# Patient Record
Sex: Male | Born: 1955 | Race: White | Hispanic: No | Marital: Married | Smoking: Former smoker
Health system: Southern US, Community
[De-identification: ages and names within clinical notes are randomized; demographics above are authoritative.]

## PROBLEM LIST (undated history)

## (undated) DIAGNOSIS — F1010 Alcohol abuse, uncomplicated: Secondary | ICD-10-CM

## (undated) DIAGNOSIS — K219 Gastro-esophageal reflux disease without esophagitis: Secondary | ICD-10-CM

## (undated) DIAGNOSIS — F329 Major depressive disorder, single episode, unspecified: Secondary | ICD-10-CM

## (undated) DIAGNOSIS — G43909 Migraine, unspecified, not intractable, without status migrainosus: Secondary | ICD-10-CM

## (undated) DIAGNOSIS — I10 Essential (primary) hypertension: Secondary | ICD-10-CM

## (undated) DIAGNOSIS — F32A Depression, unspecified: Secondary | ICD-10-CM

## (undated) DIAGNOSIS — M1990 Unspecified osteoarthritis, unspecified site: Secondary | ICD-10-CM

## (undated) DIAGNOSIS — Z87448 Personal history of other diseases of urinary system: Secondary | ICD-10-CM

## (undated) HISTORY — PX: COLONOSCOPY: SHX174

## (undated) HISTORY — DX: Essential (primary) hypertension: I10

## (undated) HISTORY — DX: Gastro-esophageal reflux disease without esophagitis: K21.9

## (undated) HISTORY — DX: Personal history of other diseases of urinary system: Z87.448

## (undated) HISTORY — DX: Migraine, unspecified, not intractable, without status migrainosus: G43.909

## (undated) HISTORY — DX: Depression, unspecified: F32.A

## (undated) HISTORY — DX: Alcohol abuse, uncomplicated: F10.10

## (undated) HISTORY — PX: HERNIA REPAIR: SHX51

## (undated) HISTORY — DX: Unspecified osteoarthritis, unspecified site: M19.90

---
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
## 2018-11-17 ENCOUNTER — Ambulatory Visit (INDEPENDENT_AMBULATORY_CARE_PROVIDER_SITE_OTHER): Payer: Managed Care, Other (non HMO)

## 2018-11-17 ENCOUNTER — Ambulatory Visit (INDEPENDENT_AMBULATORY_CARE_PROVIDER_SITE_OTHER): Payer: Managed Care, Other (non HMO) | Admitting: Orthopaedic Surgery

## 2018-11-17 ENCOUNTER — Encounter: Payer: Self-pay | Admitting: Orthopaedic Surgery

## 2018-11-17 VITALS — BP 148/88 | HR 100 | Ht 70.0 in | Wt 218.0 lb

## 2018-11-17 DIAGNOSIS — M25562 Pain in left knee: Secondary | ICD-10-CM | POA: Diagnosis not present

## 2018-11-21 ENCOUNTER — Encounter: Payer: Self-pay | Admitting: Orthopaedic Surgery

## 2018-11-21 NOTE — Progress Notes (Addendum)
Office Visit Note   Patient: Erik Ayers           Date of Birth: 06-19-1955           MRN: [id number] Visit Date: 11/17/2018              Requested by: No referring provider defined for this encounter. PCP: Toma Deiters, MD   Assessment & Plan: Visit Diagnoses:  1. Acute pain of left knee     Plan: 63 year old male with greater than 105-month history of persistent knee catching pain failure to respond to conservative treatment.  We discussed options including MRI scan versus continue strengthening exercises anti-inflammatories Voltaren cream.  Patient states he like proceed with an MRI since his knee is catching on him he is concerned he has a meniscal tear office follow-up after scan for review.  Follow-Up Instructions: return after knee MRI  Orders:  Orders Placed This Encounter  Procedures  . XR KNEE 3 VIEW LEFT  . MR Knee Left w/o contrast   No orders of the defined types were placed in this encounter.     Procedures: No procedures performed   Clinical Data: No additional findings.   Subjective: Chief Complaint  Patient presents with  . Left Knee - Pain    HPI 63 year old male with 103-month history of left knee pain that began insidiously no known injury.  He is taking over-the-counter medication and also using both tearing cream 3 times a day with temporary relief of his left leg.  Patient states he has been limping and now starting have some increased problems with his right leg.  Patient states that when he was walking felt like his left knee popped out to the right and he had sharp pain on the inside joint line.  No past history of knee surgeries.referred to me by Dr. Olena Leatherwood for continued left knee pain.   Review of Systems.  Umbilical hernia patient smokes 3 cigarettes/day. Positive   For current knee pain left knee.  Possible acid reflux history of alcoholism.  Bladder problems depression, hypertension, migraines.  Hernia repair 2018.  Patient states he  rarely drinks with his past history of alcoholism.  He takes medications for cholesterol and also hypertension.   Objective: Vital Signs: BP (!) 148/88   Pulse 100   Ht 5\' 10"  (1.778 m)   Wt 218 lb (98.9 kg)   BMI 31.28 kg/m   Physical Exam Constitutional:      Appearance: He is well-developed.  HENT:     Head: Normocephalic and atraumatic.  Eyes:     Pupils: Pupils are equal, round, and reactive to light.  Neck:     Thyroid: No thyromegaly.     Trachea: No tracheal deviation.  Cardiovascular:     Rate and Rhythm: Normal rate.  Pulmonary:     Effort: Pulmonary effort is normal.     Breath sounds: No wheezing.  Abdominal:     General: Bowel sounds are normal.     Palpations: Abdomen is soft.  Skin:    General: Skin is warm and dry.     Capillary Refill: Capillary refill takes less than 2 seconds.  Neurological:     Mental Status: He is alert and oriented to person, place, and time.  Psychiatric:        Behavior: Behavior normal.        Thought Content: Thought content normal.        Judgment: Judgment normal.  Ortho Exam patient is ambulatory with a mild left knee limp.  He has some knee pain when he gets from sitting to standing.  Negative logroll to the hips.  Negative straight leg raising degrees -5 depression test.  Medial joint line left knee is tender with palpation.  Pes bursa is normal hamstrings are normal.  Mild crepitus with patellar tracking.  No patellar instability.  ACL PCL exam is normal.  Specialty Comments:  No specialty comments available.  Imaging: Three-view x-rays left knee obtained and reviewed.  Negative for acute bony changes.  Some joint space narrowing and spurring consistent with chondromalacia.  Impression.  Left knee x-rays negative for acute changes.  No soft tissue calcification.    PMFS History: There are no active problems to display for this patient.  Past Medical History:  Diagnosis Date  . Alcohol abuse   . Arthritis    . Depression   . GERD (gastroesophageal reflux disease)   . H/O bladder problems   . Hypertension   . Migraines     History reviewed. No pertinent family history.  History reviewed. No pertinent surgical history. Social History   Occupational History  . Not on file  Tobacco Use  . Smoking status: Current Every Day Smoker  . Smokeless tobacco: Never Used  Substance and Sexual Activity  . Alcohol use: Yes  . Drug use: Not on file  . Sexual activity: Not on file

## 2018-12-06 ENCOUNTER — Telehealth: Payer: Self-pay | Admitting: Radiology

## 2018-12-06 NOTE — Telephone Encounter (Signed)
He had cons tx by LMD for 3 months. Call pt let him know denied. He can call insurance co. if he wants. ROV 6 wks and will resubmit.

## 2018-12-06 NOTE — Telephone Encounter (Signed)
MRI ordered for patient has been denied by insurance due to no information regarding failed conservative treatment for 6 weeks. Denial paperwork put on your desk to review. Please advise what you would like for me to do going further.

## 2018-12-07 NOTE — Telephone Encounter (Signed)
I left detailed message explaining to patient. I did advise he could call insurance company or make follow up appt to see Dr. Lorin Mercy 6 weeks after last visit to resubmit.

## 2018-12-08 ENCOUNTER — Telehealth: Payer: Self-pay | Admitting: Radiology

## 2018-12-08 NOTE — Telephone Encounter (Signed)
Patient is having a lot of pain in his knee. He is awaiting follow up in the office to resubmit for MRI due to insurance denial.  He requests work note for no prolonged standing until MRI is complete.  Please advise. CB for pt is [phone]

## 2018-12-08 NOTE — Telephone Encounter (Signed)
Yes OK to do thanks 

## 2018-12-09 NOTE — Telephone Encounter (Signed)
Note entered. Copy left in [place] office for patient to pick up.  I left voicemail for patient advising.

## 2018-12-15 ENCOUNTER — Encounter: Payer: Self-pay | Admitting: Orthopaedic Surgery

## 2018-12-15 ENCOUNTER — Ambulatory Visit (INDEPENDENT_AMBULATORY_CARE_PROVIDER_SITE_OTHER): Payer: Managed Care, Other (non HMO) | Admitting: Orthopaedic Surgery

## 2018-12-15 ENCOUNTER — Other Ambulatory Visit: Payer: Self-pay

## 2018-12-15 DIAGNOSIS — M2392 Unspecified internal derangement of left knee: Secondary | ICD-10-CM | POA: Diagnosis not present

## 2018-12-15 NOTE — Addendum Note (Signed)
Addended by: Meyer Cory on: 12/15/2018 02:24 PM   Modules accepted: Orders

## 2018-12-15 NOTE — Progress Notes (Signed)
Office Visit Note   Patient: Erik Ayers           Date of Birth: [birth year]           MRN: [id number] Visit Date: 12/15/2018              Requested by: Toma Deiters, MD [address] [place],  [place] [address] PCP: Toma Deiters, MD   Assessment & Plan: Visit Diagnoses:  1. Knee locking, left     Plan: Patient is having persistent knee symptoms now 4 months with medial joint line pain failed conservative treatment anti-inflammatories exercise program.  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  Patient needs an MRI scan as I stated last month.  We will resubmit this to his insurance company.  Follow-Up Instructions: No follow-ups on file.   Orders:  No orders of the defined types were placed in this encounter.  No orders of the defined types were placed in this encounter.     Procedures: No procedures performed   Clinical Data: No additional findings.   Subjective: Chief Complaint  Patient presents with  . Left Knee - Pain, Follow-up    HPI 63 year old male returns with persistent problems with his left knee.  He had trouble doing his work and states he is now been put on short-term disability since MRI scan could not be obtained since it was not approved by his insurance.  Prior to me seeing the patient 11/17/2018 he had had greater than 6 weeks conservative treatment including anti-inflammatories, topical Voltaren cream, activity modification, quadriceps exercises.  He has been treated by his PCP  Dr. Olena Leatherwood conservatively prior to referral to me on 11/17/2018.  Patient states his knee still hurts primarily in the medial side he still limping still having catching in his knee.  Review of Systems 14 point review of systems positive for 3 cigarette smoker per day plus for umbilical hernia.  Positive for acid reflux.  History of depression migraines, hypertension hernia repair 2018  otherwise unchanged from 11/17/2018 other than as mentioned HPI.   Objective: Vital Signs: BP (!) 150/94   Pulse 92   Ht 5\' 10"  (1.778 m)   Wt 220 lb (99.8 kg)   BMI 31.57 kg/m   Physical Exam Constitutional:      Appearance: He is well-developed.  HENT:     Head: Normocephalic and atraumatic.  Eyes:     Pupils: Pupils are equal, round, and reactive to light.  Neck:     Thyroid: No thyromegaly.     Trachea: No tracheal deviation.  Cardiovascular:     Rate and Rhythm: Normal rate.  Pulmonary:     Effort: Pulmonary effort is normal.     Breath sounds: No wheezing.  Abdominal:     General: Bowel sounds are normal.     Palpations: Abdomen is soft.  Skin:    General: Skin is warm and dry.     Capillary Refill: Capillary refill takes less than 2 seconds.  Neurological:     Mental Status: He is alert and oriented to person, place, and time.  Psychiatric:        Behavior: Behavior normal.        Thought Content: Thought content normal.        Judgment: Judgment normal.     Ortho Exam patient is amatory with a left knee limp he has medial  joint line tenderness with palpation.  Anterior drawer is negative negative Lachman.No tenderness over the pes bursa or Gertie's tubercle.  He still has crepitus with knee flexion extension primarily with patellofemoral loading.  No lateral subluxation of the patella.  Negative logroll to the hips no sciatic notch tenderness. Specialty Comments:  No specialty comments available.  Imaging: No results found.   PMFS History: Patient Active Problem List   Diagnosis Date Noted  . Knee locking, left 12/15/2018   Past Medical History:  Diagnosis Date  . Alcohol abuse   . Arthritis   . Depression   . GERD (gastroesophageal reflux disease)   . H/O bladder problems   . Hypertension   . Migraines     No family history on file.  No past surgical history on file. Social History   Occupational History  . Not on file  Tobacco Use  .  Smoking status: Current Every Day Smoker  . Smokeless tobacco: Never Used  Substance and Sexual Activity  . Alcohol use: Yes  . Drug use: Not on file  . Sexual activity: Not on file

## 2018-12-28 ENCOUNTER — Encounter: Payer: Self-pay | Admitting: Orthopaedic Surgery

## 2019-01-02 ENCOUNTER — Telehealth: Payer: Self-pay | Admitting: Orthopaedic Surgery

## 2019-01-02 NOTE — Telephone Encounter (Signed)
I called patient. He is inquiring about forms that he took to the [place] office to be completed. I do not have forms here. Tammy does not see where they have been logged in with [employer]. I have reached out to [place] in [place] office to see if forms are there.  Awaiting response from [place].

## 2019-01-02 NOTE — Telephone Encounter (Signed)
Patient called. Would like to speak with [facility]. His call back number is [phone]

## 2019-01-02 NOTE — Telephone Encounter (Signed)
Forms faxed to me from [place] office. I will complete and fax in today.

## 2019-01-02 NOTE — Telephone Encounter (Signed)
I left voicemail for patient requesting return call to let me know first date out of work.

## 2019-01-03 ENCOUNTER — Telehealth: Payer: Self-pay | Admitting: Orthopaedic Surgery

## 2019-01-03 NOTE — Telephone Encounter (Signed)
Duplicate message in chart. Form has been completed and faxed. I left voicemail for patient advising.

## 2019-01-03 NOTE — Telephone Encounter (Signed)
I left voicemail for patient advising form has been faxed.

## 2019-01-03 NOTE — Telephone Encounter (Signed)
Pt called in wanting to let betsy know the information she needed to complete his STD forms. Pt said his first day out of work was 12-12-2018.  [phone]

## 2019-01-05 ENCOUNTER — Ambulatory Visit (INDEPENDENT_AMBULATORY_CARE_PROVIDER_SITE_OTHER): Payer: Managed Care, Other (non HMO) | Admitting: Orthopaedic Surgery

## 2019-01-05 ENCOUNTER — Encounter: Payer: Self-pay | Admitting: Orthopaedic Surgery

## 2019-01-05 DIAGNOSIS — M1712 Unilateral primary osteoarthritis, left knee: Secondary | ICD-10-CM | POA: Diagnosis not present

## 2019-01-05 NOTE — Progress Notes (Signed)
Office Visit Note   Patient: Erik Ayers           Date of Birth: 08/05/1955           MRN: [id number] Visit Date: 01/05/2019              Requested by: Neale Burly, MD [place],  [place] [address] PCP: Neale Burly, MD   Assessment & Plan: Visit Diagnoses:  1. 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  MRI scan is reviewed which shows bone-on-bone changes medial compartment and areas of complete cartilage loss in medial compartment with sparing of the lateral compartment.  Patient has subchondral edema in the medial compartment.  Medial meniscal posterior complex meniscal tear and some extrusion of the medial meniscus.  I discussed them I do not think that knee arthroscopy is likely given the relief that he would need and with bone-on-bone changes medial compartment he would require total knee arthroplasty.  His varus alignment makes results of a medial unicompartment arthroplasty less likely to be successful.  We discussed total knee arthroplasty looked at knee models discussed home therapy for couple weeks post outpatient therapy.  We discussed overnight stay in the hospital and some of the restrictions and decreased elective surgery due to Covid currently in place.  Procedure discussed we will try to get this scheduled whenever we can.  Currently he remains out of work pending total knee arthroplasty and postop rehab.  Follow-Up Instructions: No follow-ups on file.   Orders:  No orders of the defined types were placed in this encounter.  No orders of the defined types were placed in this encounter.     Procedures: No procedures performed   Clinical Data: No additional findings.   Subjective: Chief Complaint  Patient presents with  . Left Knee - Pain, Follow-up    MRI review    HPI 63 year old male returns with ongoing problems with  his left knee with pain medially.  On his job due to [employer] he been using a standing forklift which is more active than the job he been doing before Covid worries mostly sitting down.  When he standing on a forklift he had to use his leg for pushing on the paddles and had increased knee pain swelling and catching.  MRI scan has been obtained and is available for review.  Review of Systems plus for previous umbilical hernia acid reflux.  History of depression migraines hypertension.  Hernia repair 2018.  Positive for left greater than right knee pain with left knee osteoarthritis bone-on-bone changes medial compartment.  Patient smokes 3 cigarettes/day.   Objective: Vital Signs: Ht 5\' 10"  (1.778 m)   Wt 220 lb (99.8 kg)   BMI 31.57 kg/m   Physical Exam Constitutional:      Appearance: He is well-developed.  HENT:     Head: Normocephalic and atraumatic.  Eyes:     Pupils: Pupils are equal, round, and reactive to light.  Neck:     Thyroid: No thyromegaly.     Trachea: No tracheal deviation.  Cardiovascular:     Rate and Rhythm: Normal rate.  Pulmonary:     Effort: Pulmonary effort is normal.     Breath sounds: No wheezing.  Abdominal:     General: Bowel sounds are normal.     Palpations: Abdomen is soft.  Skin:    General: Skin is warm and dry.     Capillary Refill: Capillary refill takes less than 2 seconds.  Neurological:     Mental Status: He is alert and oriented to person, place, and time.  Psychiatric:        Behavior: Behavior normal.        Thought Content: Thought content normal.        Judgment: Judgment normal.     Ortho Exam finished patient has 5 to 10 degrees varus standing position left knee.  Medial joint line tenderness 2+ knee effusion crepitus with knee range of motion pain with patellofemoral loading and knee extension.  No lateral joint line tenderness.  Distal pulses are 2 logroll hips.  No sciatic notch tenderness.  Specialty Comments:  No specialty  comments available.  Imaging: MRI scan done at [facility] radiology 12/28/2018 showed complex medial meniscal tear with some extrusion of medial meniscus.  Significant medial compartment arthritis.  There is edema in the bone bone-on-bone changes cartilage cartilage wear areas of denuded cartilage.  Small Baker's cyst.  Relative sparing of the lateral compartment.   Plain radiographs demonstrated 7 degrees varus with bone-on-bone changes medial compartment marginal osteophytes and some subchondral sclerosis in the medial compartment.    PMFS History: Patient Active Problem List   Diagnosis Date Noted  . Unilateral primary osteoarthritis, left knee 01/05/2019  . Knee locking, left 12/15/2018   Past Medical History:  Diagnosis Date  . Alcohol abuse   . Arthritis   . Depression   . GERD (gastroesophageal reflux disease)   . H/O bladder problems   . Hypertension   . Migraines     No family history on file.  No past surgical history on file. Social History   Occupational History  . Not on file  Tobacco Use  . Smoking status: Current Every Day Smoker  . Smokeless tobacco: Never Used  Substance and Sexual Activity  . Alcohol use: Yes  . Drug use: Not on file  . Sexual activity: Not on file

## 2019-01-10 ENCOUNTER — Telehealth: Payer: Self-pay | Admitting: Orthopaedic Surgery

## 2019-01-10 NOTE — Telephone Encounter (Signed)
Left message on patient's voicemail requesting he call to discuss scheduling left total knee surgery.  Provided name and my direct number.

## 2019-01-12 ENCOUNTER — Other Ambulatory Visit: Payer: Self-pay

## 2019-01-20 ENCOUNTER — Telehealth: Payer: Self-pay | Admitting: Orthopaedic Surgery

## 2019-01-20 NOTE — Telephone Encounter (Signed)
Please advise 

## 2019-01-20 NOTE — Telephone Encounter (Signed)
Patient called.   He is requesting that he be prescribed pain meds for his knee.   Call back number: [phone]

## 2019-01-23 MED ORDER — TRAMADOL HCL 50 MG PO TABS: 50.0000 mg | ORAL_TABLET | Freq: Two times a day (BID) | ORAL | 0 refills | Status: DC | PRN

## 2019-01-23 NOTE — Telephone Encounter (Addendum)
Short term disability claim runs out for patient in the next couple of weeks.  Patient would like for [place] to send letter to [facility] stating he is unable to have surgery due to COVID restrictions and that he will continue to be out of work until post op recovery is completed.    OK for note?  Medication called to pharmacy. Patient aware.

## 2019-01-23 NOTE — Addendum Note (Signed)
Addended by: Rogers Seeds on: 01/23/2019 04:56 PM   Modules accepted: Orders

## 2019-01-23 NOTE — Telephone Encounter (Signed)
Called pt no answer will call again to discuss. Was scheduled for TKA at one point.

## 2019-01-23 NOTE — Telephone Encounter (Signed)
Call in ultram 50mg     # 30   one po q 12 hrs prn pain  thanks [facility]

## 2019-02-03 ENCOUNTER — Other Ambulatory Visit: Payer: Self-pay

## 2019-02-03 ENCOUNTER — Telehealth: Payer: Self-pay | Admitting: Orthopaedic Surgery

## 2019-02-03 NOTE — Telephone Encounter (Signed)
Patient called advised the [facility] [place] need prior auth before they will fill the Rx for Tramadol. The number to contact patient is [phone]

## 2019-02-06 ENCOUNTER — Ambulatory Visit: Admit: 2019-02-06 | Payer: Managed Care, Other (non HMO) | Admitting: Orthopaedic Surgery

## 2019-02-06 SURGERY — ARTHROPLASTY, KNEE, TOTAL
Anesthesia: Spinal | Site: Knee | Laterality: Left

## 2019-02-06 NOTE — Telephone Encounter (Signed)
I called [facility] and pharmacist explained that patient needed prior auth due to number prescribed being greater than 14 days.  Number for prior auth [phone].  I called prior auth line and spoke with Inetta Fermo. Medication approved for #60 a month x 6 months.  She will fax authorization to pharmacy.   I called patient and advised.

## 2019-02-07 NOTE — Progress Notes (Signed)
[employer] James Owens, PA-C for procedure orders. 

## 2019-02-08 ENCOUNTER — Encounter (HOSPITAL_COMMUNITY)
Admission: RE | Admit: 2019-02-08 | Discharge: 2019-02-08 | Disposition: A | Discharge status: Home / Self Care | Payer: Managed Care, Other (non HMO) | Source: Ambulatory Visit | Attending: Orthopaedic Surgery | Admitting: Orthopaedic Surgery

## 2019-02-08 ENCOUNTER — Ambulatory Visit (HOSPITAL_COMMUNITY)
Admission: RE | Admit: 2019-02-08 | Discharge: 2019-02-08 | Disposition: A | Discharge status: Home / Self Care | Payer: Managed Care, Other (non HMO) | Source: Ambulatory Visit | Attending: Surgery | Admitting: Surgery

## 2019-02-08 ENCOUNTER — Other Ambulatory Visit: Payer: Self-pay

## 2019-02-08 ENCOUNTER — Encounter (HOSPITAL_COMMUNITY): Payer: Self-pay

## 2019-02-08 DIAGNOSIS — Z01818 Encounter for other preprocedural examination: Secondary | ICD-10-CM

## 2019-02-08 LAB — COMPREHENSIVE METABOLIC PANEL
ALT: 51 U/L — ABNORMAL HIGH (ref 0–44)
AST: 35 U/L (ref 15–41)
Albumin: 4.4 g/dL (ref 3.5–5.0)
Alkaline Phosphatase: 100 U/L (ref 38–126)
Anion gap: 13 (ref 5–15)
BUN: 9 mg/dL (ref 8–23)
CO2: 26 mmol/L (ref 22–32)
Calcium: 9.7 mg/dL (ref 8.9–10.3)
Chloride: 101 mmol/L (ref 98–111)
Creatinine, Ser: 0.93 mg/dL (ref 0.61–1.24)
GFR calc Af Amer: >60 mL/min (ref >60)
GFR calc non Af Amer: >60 mL/min (ref >60)
Glucose, Bld: 86 mg/dL (ref 70–99)
Potassium: 3.9 mmol/L (ref 3.5–5.1)
Sodium: 140 mmol/L (ref 135–145)
Total Bilirubin: 0.5 mg/dL (ref 0.3–1.2)
Total Protein: 7.6 g/dL (ref 6.5–8.1)

## 2019-02-08 LAB — URINALYSIS, ROUTINE W REFLEX MICROSCOPIC
Bilirubin Urine: NEGATIVE
Glucose, UA: NEGATIVE mg/dL
Hgb urine dipstick: NEGATIVE
Ketones, ur: NEGATIVE mg/dL
Leukocytes,Ua: NEGATIVE
Nitrite: NEGATIVE
Protein, ur: NEGATIVE mg/dL
Specific Gravity, Urine: 1.016 (ref 1.005–1.030)
pH: 7 (ref 5.0–8.0)

## 2019-02-08 LAB — CBC
HCT: 49.4 % (ref 39.0–52.0)
Hemoglobin: 16.7 g/dL (ref 13.0–17.0)
MCH: 32.6 pg (ref 26.0–34.0)
MCHC: 33.8 g/dL (ref 30.0–36.0)
MCV: 96.5 fL (ref 80.0–100.0)
Platelets: 247 10*3/uL (ref 150–400)
RBC: 5.12 MIL/uL (ref 4.22–5.81)
RDW: 12.8 % (ref 11.5–15.5)
WBC: 9.5 10*3/uL (ref 4.0–10.5)
nRBC: 0 % (ref 0.0–0.2)

## 2019-02-08 LAB — SURGICAL PCR SCREEN
MRSA, PCR: NEGATIVE
Staphylococcus aureus: POSITIVE — AB

## 2019-02-08 NOTE — Pre-Procedure Instructions (Signed)
Your procedure is scheduled on Friday, February 12th, from 10:03 AM to 12:03 PM.  Report to [facility] Main Entrance "A" at 08:00 A.M., and check in at the Admitting office.   Call this number if you have problems the morning of surgery:  [phone]    Remember:  Do not eat after midnight the night before your surgery.  You may drink clear liquids until 07:00 AM the morning of your surgery.    Clear liquids allowed are: Water, Non-Citrus Juices (without pulp), Carbonated Beverages, Clear Tea, Black Coffee Only, and Gatorade.    Enhanced Recovery after Surgery for Orthopedics Enhanced Recovery after Surgery is a protocol used to improve the stress on your body and your recovery after surgery.  .  . The day of surgery (if you do NOT have diabetes):  o Drink ONE (1) Pre-Surgery Clear Ensure by 07:00 AM, the morning of your surgery.   o This drink was given to you during your hospital  pre-op appointment visit. o Nothing else to drink after completing the  Pre-Surgery Clear Ensure.  Please, if able, drink it in one setting. DO NOT SIP.     Take these medicines the morning of surgery with A SIP OF WATER :  atorvastatin (LIPITOR)  IF NEEDED: lansoprazole (PREVACID) traMADol (ULTRAM)   7 days prior to surgery STOP taking any Aspirin (unless otherwise instructed by your surgeon), Aleve, Naproxen, Ibuprofen, Motrin, Advil, Goody's, BC's, all herbal medications, fish oil, and all vitamins.    The Morning of Surgery  Do not wear jewelry.  Do not wear lotions, powders, colognes, or deodorant  Men may shave face and neck.  Do not bring valuables to the hospital.  [facility] is not responsible for any belongings or valuables.  If you are a smoker, DO NOT Smoke 24 hours prior to surgery  If you wear a CPAP at night please bring your mask the morning of surgery   Remember that you must have someone to transport you home after your surgery, and remain with you for 24 hours  if you are discharged the same day.   Please bring cases for contacts, glasses, hearing aids, dentures or bridgework because it cannot be worn into surgery.    Leave your suitcase in the car.  After surgery it may be brought to your room.  For patients admitted to the hospital, discharge time will be determined by your treatment team.  Patients discharged the day of surgery will not be allowed to drive home.    Special instructions:   [facility]- Preparing For Surgery  Before surgery, you can play an important role. Because skin is not sterile, your skin needs to be as free of germs as possible. You can reduce the number of germs on your skin by washing with CHG (chlorahexidine gluconate) Soap before surgery.  CHG is an antiseptic cleaner which kills germs and bonds with the skin to continue killing germs even after washing.     Please do not use if you have an allergy to CHG or antibacterial soaps. If your skin becomes reddened/irritated stop using the CHG.  Do not shave (including legs and underarms) for at least 48 hours prior to first CHG shower. It is OK to shave your face.  Please follow these instructions carefully.   1. Shower the NIGHT BEFORE SURGERY and the MORNING OF SURGERY with CHG Soap.   2. If you chose to wash your hair, wash your hair first as usual with  your normal shampoo.  3. After you shampoo, rinse your hair and body thoroughly to remove the shampoo.  4. Use CHG as you would any other liquid soap. You can apply CHG directly to the skin and wash gently with a scrungie or a clean washcloth.   5. Apply the CHG Soap to your body ONLY FROM THE NECK DOWN.  Do not use on open wounds or open sores. Avoid contact with your eyes, ears, mouth and genitals (private parts). Wash Face and genitals (private parts)  with your normal soap.   6. Wash thoroughly, paying special attention to the area where your surgery will be performed.  7. Thoroughly rinse your body with warm  water from the neck down.  8. DO NOT shower/wash with your normal soap after using and rinsing off the CHG Soap.  9. Pat yourself dry with a CLEAN TOWEL.  10. Wear CLEAN PAJAMAS to bed the night before surgery, wear comfortable clothes the morning of surgery  11. Place CLEAN SHEETS on your bed the night of your first shower and DO NOT SLEEP WITH PETS.    Day of Surgery:  Remember to brush your teeth WITH YOUR REGULAR TOOTHPASTE. Please shower the morning of surgery with the CHG soap Do not apply any deodorants/lotions. Please wear clean clothes to the hospital/surgery center.      Please read over the following fact sheets that you were given.

## 2019-02-08 NOTE — Progress Notes (Signed)
   02/08/19 1315  OBSTRUCTIVE SLEEP APNEA  Have you ever been diagnosed with sleep apnea through a sleep study? No  Do you snore loudly (loud enough to be heard through closed doors)?  1  Do you often feel tired, fatigued, or sleepy during the daytime (such as falling asleep during driving or talking to someone)? 0  Has anyone observed you stop breathing during your sleep? 0  Do you have, or are you being treated for high blood pressure? 1  BMI more than 35 kg/m2? 0  Age > 50 (1-yes) 1  Neck circumference greater than:Male 16 inches or larger, Male 17inches or larger? 1  Male Gender (Yes=1) 1  Obstructive Sleep Apnea Score 5  Score 5 or greater  Results sent to PCP

## 2019-02-08 NOTE — Progress Notes (Signed)
PCP - Lia Hopping, MD Cardiologist - Denies  PPM/ICD - Denies  Chest x-ray - 02/08/19 EKG - 02/08/19 Stress Test - Per patient, in 2016 @ [facility], negative results; records requested ECHO - Denies Cardiac Cath - Denies  Sleep Study - Denies  Patient denies being a Diabetic.   Blood Thinner Instructions: N/A Aspirin Instructions: N/A  ERAS Protcol - Yes PRE-SURGERY Ensure or G2- Ensure given  COVID TEST- 02/14/19 @  [facility]   Anesthesia review: Yes; Stress test records requested.  Patient denies shortness of breath, fever, cough and chest pain at PAT appointment  Coronavirus Screening  Have you experienced the following symptoms:  Cough yes/no: No Fever (>100.97F)  yes/no: No Runny nose yes/no: No Sore throat yes/no: No Difficulty breathing/shortness of breath  yes/no: No  Have you or a family member traveled in the last 14 days and where? yes/no: No   If the patient indicates "YES" to the above questions, their PAT will be rescheduled to limit the exposure to others and, the surgeon will be notified. THE PATIENT WILL NEED TO BE ASYMPTOMATIC FOR 14 DAYS.   If the patient is not experiencing any of these symptoms, the PAT nurse will instruct them to NOT bring anyone with them to their appointment since they may have these symptoms or traveled as well.   Please remind your patients and families that hospital visitation restrictions are in effect and the importance of the restrictions.    All instructions explained to the patient, with a verbal understanding of the material. Patient agrees to go over the instructions while at home for a better understanding. Patient also instructed to self quarantine after being tested for COVID-19. The opportunity to ask questions was provided.

## 2019-02-09 ENCOUNTER — Ambulatory Visit (INDEPENDENT_AMBULATORY_CARE_PROVIDER_SITE_OTHER): Payer: Managed Care, Other (non HMO) | Admitting: Surgery

## 2019-02-09 ENCOUNTER — Encounter: Payer: Self-pay | Admitting: Surgery

## 2019-02-09 VITALS — BP 130/79 | HR 91 | Ht 70.0 in | Wt 219.0 lb

## 2019-02-09 DIAGNOSIS — M1712 Unilateral primary osteoarthritis, left knee: Secondary | ICD-10-CM

## 2019-02-09 NOTE — Anesthesia Preprocedure Evaluation (Addendum)
Anesthesia Evaluation  Patient identified by MRN, date of birth, ID band Patient awake    Reviewed: Allergy & Precautions, H&P , NPO status , Patient's Chart, lab work & pertinent test results, reviewed documented beta blocker date and time   Airway Mallampati: II  TM Distance: >3 FB Neck ROM: full    Dental no notable dental hx. (+) Edentulous Upper, Edentulous Lower, Dental Advisory Given   Pulmonary neg pulmonary ROS, former smoker,    Pulmonary exam normal breath sounds clear to auscultation       Cardiovascular Exercise Tolerance: Good hypertension, Pt. on medications negative cardio ROS   Rhythm:regular Rate:Normal     Neuro/Psych  Headaches, PSYCHIATRIC DISORDERS Depression    GI/Hepatic negative GI ROS, (+)     substance abuse  alcohol use,   Endo/Other  negative endocrine ROS  Renal/GU negative Renal ROS  negative genitourinary   Musculoskeletal  (+) Arthritis , Osteoarthritis,    Abdominal   Peds  Hematology negative hematology ROS (+)   Anesthesia Other Findings   Reproductive/Obstetrics negative OB ROS                          Anesthesia Physical Anesthesia Plan  ASA: II  Anesthesia Plan: Spinal and MAC   Post-op Pain Management:  Regional for Post-op pain   Induction:   PONV Risk Score and Plan: 1 and Treatment may vary due to age or medical condition  Airway Management Planned: Nasal Cannula, Natural Airway, Simple Face Mask and Mask  Additional Equipment:   Intra-op Plan:   Post-operative Plan:   Informed Consent: I have reviewed the patients History and Physical, chart, labs and discussed the procedure including the risks, benefits and alternatives for the proposed anesthesia with the patient or authorized representative who has indicated his/her understanding and acceptance.     Dental Advisory Given  Plan Discussed with: CRNA and  Anesthesiologist  Anesthesia Plan Comments: (Pt had stress test in 2014 ordered by PCP Dr. Sherrie Sport that was negative. Results below. PCP note from 10/19/18 reviewed states pt HTN under good control. He was referred to orthopedics at that time for eval of knee pain.   Preop labs reviewed, mildly elevated AST 51. Otherwise unremarkable.   EKG 02/08/19: NSR. Rate 87.  Stress test 11/29/2012: Description of test: The patient exercised for total of 3 minutes and 52 seconds.  Maximum heart rate 569, maximal systolic blood pressure 794, diastolic blood pressure 84.  METS 7.0.  His resting EKG revealed no signs of ischemia.    Stress EKG: No ST segment elevation noted.  Conclusion of the test: Cardiac stress test felt to be negative.  As such, the patient was reassured and advised to continue with present medication and to call me if any other problems.  )      Anesthesia Quick Evaluation

## 2019-02-09 NOTE — Progress Notes (Signed)
Anesthesia Chart Review:  Pt had stress test in 2014 ordered by PCP Dr. Olena Leatherwood that was negative. Results below. PCP note from 10/19/18 reviewed states pt HTN under good control. He was referred to orthopedics at that time for eval of knee pain.   Preop labs reviewed, mildly elevated AST 51. Otherwise unremarkable.   EKG 02/08/19: NSR. Rate 87.  Stress test 11/29/2012: Description of test: The patient exercised for total of 3 minutes and 52 seconds.  Maximum heart rate 144, maximal systolic blood pressure 163, diastolic blood pressure 84.  METS 7.0.  His resting EKG revealed no signs of ischemia.    Stress EKG: No ST segment elevation noted.  Conclusion of the test: Cardiac stress test felt to be negative.  As such, the patient was reassured and advised to continue with present medication and to call me if any other problems.   Zannie Cove [facility] Short Stay Center/Anesthesiology Phone [phone] 02/09/2019 12:32 PM

## 2019-02-09 NOTE — Progress Notes (Signed)
64 year old white male history of end-stage DJD left knee and pain comes in for preop evaluation.  States that symptoms unchanged from previous visit.  He is wanting to proceed with left total knee replacement as scheduled.  Today history and physical performed.  Surgical procedure along with potential rehab/recovery time discussed.  All questions answered.

## 2019-02-14 ENCOUNTER — Other Ambulatory Visit: Payer: Self-pay

## 2019-02-14 ENCOUNTER — Other Ambulatory Visit (HOSPITAL_COMMUNITY)
Admission: RE | Admit: 2019-02-14 | Discharge: 2019-02-14 | Disposition: A | Discharge status: Home / Self Care | Payer: Managed Care, Other (non HMO) | Source: Ambulatory Visit | Attending: Orthopaedic Surgery | Admitting: Orthopaedic Surgery

## 2019-02-14 DIAGNOSIS — Z20822 Contact with and (suspected) exposure to covid-19: Secondary | ICD-10-CM | POA: Diagnosis not present

## 2019-02-14 DIAGNOSIS — Z01812 Encounter for preprocedural laboratory examination: Secondary | ICD-10-CM | POA: Diagnosis present

## 2019-02-14 LAB — SARS CORONAVIRUS 2 (TAT 6-24 HRS): SARS Coronavirus 2: NEGATIVE

## 2019-02-17 ENCOUNTER — Encounter (HOSPITAL_COMMUNITY): Payer: Self-pay | Admitting: Orthopaedic Surgery

## 2019-02-17 ENCOUNTER — Ambulatory Visit (HOSPITAL_COMMUNITY): Payer: Managed Care, Other (non HMO) | Admitting: Anesthesiology

## 2019-02-17 ENCOUNTER — Ambulatory Visit (HOSPITAL_COMMUNITY): Payer: Managed Care, Other (non HMO) | Admitting: Vascular Surgery

## 2019-02-17 ENCOUNTER — Observation Stay (HOSPITAL_COMMUNITY)
Admission: RE | Admit: 2019-02-17 | Discharge: 2019-02-18 | Disposition: A | Discharge status: Home / Self Care | Payer: Managed Care, Other (non HMO) | Attending: Orthopaedic Surgery | Admitting: Orthopaedic Surgery

## 2019-02-17 ENCOUNTER — Observation Stay (HOSPITAL_COMMUNITY): Payer: Managed Care, Other (non HMO)

## 2019-02-17 ENCOUNTER — Other Ambulatory Visit: Payer: Self-pay

## 2019-02-17 ENCOUNTER — Encounter (HOSPITAL_COMMUNITY)
Admission: RE | Disposition: A | Discharge status: Home / Self Care | Payer: Self-pay | Source: Home / Self Care | Attending: Orthopaedic Surgery

## 2019-02-17 DIAGNOSIS — M1712 Unilateral primary osteoarthritis, left knee: Principal | ICD-10-CM | POA: Diagnosis present

## 2019-02-17 DIAGNOSIS — Z6831 Body mass index (BMI) 31.0-31.9, adult: Secondary | ICD-10-CM | POA: Insufficient documentation

## 2019-02-17 DIAGNOSIS — I10 Essential (primary) hypertension: Secondary | ICD-10-CM | POA: Insufficient documentation

## 2019-02-17 DIAGNOSIS — Z87891 Personal history of nicotine dependence: Secondary | ICD-10-CM | POA: Diagnosis not present

## 2019-02-17 DIAGNOSIS — Z79899 Other long term (current) drug therapy: Secondary | ICD-10-CM | POA: Diagnosis not present

## 2019-02-17 DIAGNOSIS — Z09 Encounter for follow-up examination after completed treatment for conditions other than malignant neoplasm: Secondary | ICD-10-CM

## 2019-02-17 DIAGNOSIS — E669 Obesity, unspecified: Secondary | ICD-10-CM | POA: Insufficient documentation

## 2019-02-17 DIAGNOSIS — K219 Gastro-esophageal reflux disease without esophagitis: Secondary | ICD-10-CM | POA: Diagnosis not present

## 2019-02-17 HISTORY — PX: TOTAL KNEE ARTHROPLASTY: SHX125

## 2019-02-17 SURGERY — ARTHROPLASTY, KNEE, TOTAL
Anesthesia: Monitor Anesthesia Care | Site: Knee | Laterality: Left

## 2019-02-17 MED ORDER — BUPIVACAINE HCL (PF) 0.25 % IJ SOLN
INTRAMUSCULAR | Status: DC | PRN
  Administered 2019-02-17: 20 mL

## 2019-02-17 MED ORDER — 0.9 % SODIUM CHLORIDE (POUR BTL) OPTIME
TOPICAL | Status: DC | PRN
  Administered 2019-02-17: 11:00:00 1000 mL

## 2019-02-17 MED ORDER — ATORVASTATIN CALCIUM 40 MG PO TABS
40.0000 mg | ORAL_TABLET | Freq: Every day | ORAL | Status: DC
  Administered 2019-02-18: 40 mg via ORAL
  Filled 2019-02-17: qty 1

## 2019-02-17 MED ORDER — ONDANSETRON HCL 4 MG/2ML IJ SOLN: 4.0000 mg | Freq: Once | INTRAMUSCULAR | Status: DC | PRN

## 2019-02-17 MED ORDER — CEFAZOLIN SODIUM-DEXTROSE 2-4 GM/100ML-% IV SOLN
2.0000 g | INTRAVENOUS | Status: AC
  Administered 2019-02-17: 10:00:00 2 g via INTRAVENOUS

## 2019-02-17 MED ORDER — IRBESARTAN 300 MG PO TABS
300.0000 mg | ORAL_TABLET | Freq: Every day | ORAL | Status: DC
  Administered 2019-02-18: 09:00:00 300 mg via ORAL
  Filled 2019-02-17: qty 1

## 2019-02-17 MED ORDER — ASPIRIN EC 325 MG PO TBEC
325.0000 mg | DELAYED_RELEASE_TABLET | Freq: Every day | ORAL | Status: DC
  Administered 2019-02-18: 09:00:00 325 mg via ORAL
  Filled 2019-02-17: qty 1

## 2019-02-17 MED ORDER — FENTANYL CITRATE (PF) 100 MCG/2ML IJ SOLN
INTRAMUSCULAR | Status: AC
  Administered 2019-02-17: 09:00:00 100 ug via INTRAVENOUS
  Filled 2019-02-17: qty 2

## 2019-02-17 MED ORDER — METHOCARBAMOL 1000 MG/10ML IJ SOLN
500.0000 mg | Freq: Four times a day (QID) | INTRAVENOUS | Status: DC | PRN
  Filled 2019-02-17: qty 5

## 2019-02-17 MED ORDER — ATORVASTATIN CALCIUM 40 MG PO TABS: 40.0000 mg | ORAL_TABLET | Freq: Every day | ORAL | Status: DC

## 2019-02-17 MED ORDER — FENTANYL CITRATE (PF) 100 MCG/2ML IJ SOLN: 100.0000 ug | Freq: Once | INTRAMUSCULAR | Status: AC

## 2019-02-17 MED ORDER — POLYETHYLENE GLYCOL 3350 17 G PO PACK: 17.0000 g | PACK | Freq: Every day | ORAL | Status: DC | PRN

## 2019-02-17 MED ORDER — FENTANYL CITRATE (PF) 100 MCG/2ML IJ SOLN: 25.0000 ug | INTRAMUSCULAR | Status: DC | PRN

## 2019-02-17 MED ORDER — HYDROMORPHONE HCL 1 MG/ML IJ SOLN
0.5000 mg | INTRAMUSCULAR | Status: DC | PRN
  Administered 2019-02-17 – 2019-02-18 (×3): 1 mg via INTRAVENOUS
  Filled 2019-02-17 (×3): qty 1

## 2019-02-17 MED ORDER — OXYCODONE HCL 5 MG/5ML PO SOLN: 5.0000 mg | Freq: Once | ORAL | Status: DC | PRN

## 2019-02-17 MED ORDER — PHENOL 1.4 % MT LIQD: 1.0000 | OROMUCOSAL | Status: DC | PRN

## 2019-02-17 MED ORDER — FENTANYL CITRATE (PF) 100 MCG/2ML IJ SOLN: INTRAMUSCULAR | Status: DC | PRN

## 2019-02-17 MED ORDER — SODIUM CHLORIDE 0.9 % IR SOLN
Status: DC | PRN
  Administered 2019-02-17: 3000 mL

## 2019-02-17 MED ORDER — ONDANSETRON HCL 4 MG/2ML IJ SOLN
INTRAMUSCULAR | Status: DC | PRN
  Administered 2019-02-17: 4 mg via INTRAVENOUS

## 2019-02-17 MED ORDER — FENTANYL CITRATE (PF) 250 MCG/5ML IJ SOLN
INTRAMUSCULAR | Status: AC
  Filled 2019-02-17: qty 5

## 2019-02-17 MED ORDER — HYDROCHLOROTHIAZIDE 25 MG PO TABS
12.5000 mg | ORAL_TABLET | Freq: Every day | ORAL | Status: DC
  Administered 2019-02-18: 09:00:00 12.5 mg via ORAL
  Filled 2019-02-17: qty 1

## 2019-02-17 MED ORDER — ONDANSETRON HCL 4 MG PO TABS: 4.0000 mg | ORAL_TABLET | Freq: Four times a day (QID) | ORAL | Status: DC | PRN

## 2019-02-17 MED ORDER — ACETAMINOPHEN 160 MG/5ML PO SOLN: 325.0000 mg | ORAL | Status: DC | PRN

## 2019-02-17 MED ORDER — MIDAZOLAM HCL 2 MG/2ML IJ SOLN
INTRAMUSCULAR | Status: AC
  Administered 2019-02-17: 2 mg via INTRAVENOUS
  Filled 2019-02-17: qty 2

## 2019-02-17 MED ORDER — BUPIVACAINE IN DEXTROSE 0.75-8.25 % IT SOLN
INTRATHECAL | Status: DC | PRN
  Administered 2019-02-17: 1.8 mL via INTRATHECAL

## 2019-02-17 MED ORDER — DEXAMETHASONE SODIUM PHOSPHATE 10 MG/ML IJ SOLN
INTRAMUSCULAR | Status: AC
  Filled 2019-02-17: qty 1

## 2019-02-17 MED ORDER — BUPIVACAINE LIPOSOME 1.3 % IJ SUSP
INTRAMUSCULAR | Status: DC | PRN
  Administered 2019-02-17: 20 mL

## 2019-02-17 MED ORDER — PHENYLEPHRINE HCL-NACL 10-0.9 MG/250ML-% IV SOLN
INTRAVENOUS | Status: DC | PRN
  Administered 2019-02-17: 50 ug/min via INTRAVENOUS

## 2019-02-17 MED ORDER — ROPIVACAINE HCL 7.5 MG/ML IJ SOLN
INTRAMUSCULAR | Status: DC | PRN
  Administered 2019-02-17: 30 mL via PERINEURAL

## 2019-02-17 MED ORDER — ONDANSETRON HCL 4 MG/2ML IJ SOLN
INTRAMUSCULAR | Status: AC
  Filled 2019-02-17: qty 2

## 2019-02-17 MED ORDER — BUPIVACAINE HCL (PF) 0.25 % IJ SOLN
INTRAMUSCULAR | Status: AC
  Filled 2019-02-17: qty 10

## 2019-02-17 MED ORDER — OXYCODONE HCL 5 MG PO TABS
5.0000 mg | ORAL_TABLET | ORAL | Status: DC | PRN
  Administered 2019-02-17 – 2019-02-18 (×4): 10 mg via ORAL
  Filled 2019-02-17 (×4): qty 2

## 2019-02-17 MED ORDER — METOCLOPRAMIDE HCL 5 MG PO TABS: 5.0000 mg | ORAL_TABLET | Freq: Three times a day (TID) | ORAL | Status: DC | PRN

## 2019-02-17 MED ORDER — OXYCODONE-ACETAMINOPHEN 5-325 MG PO TABS: 1.0000 | ORAL_TABLET | Freq: Four times a day (QID) | ORAL | 0 refills | Status: DC | PRN

## 2019-02-17 MED ORDER — ACETAMINOPHEN 325 MG PO TABS
325.0000 mg | ORAL_TABLET | Freq: Four times a day (QID) | ORAL | Status: DC | PRN
  Filled 2019-02-17: qty 2

## 2019-02-17 MED ORDER — METHOCARBAMOL 500 MG PO TABS: 500.0000 mg | ORAL_TABLET | Freq: Four times a day (QID) | ORAL | 0 refills | Status: DC | PRN

## 2019-02-17 MED ORDER — MENTHOL 3 MG MT LOZG: 1.0000 | LOZENGE | OROMUCOSAL | Status: DC | PRN

## 2019-02-17 MED ORDER — CLONIDINE HCL (ANALGESIA) 100 MCG/ML EP SOLN
EPIDURAL | Status: DC | PRN
  Administered 2019-02-17: 100 ug

## 2019-02-17 MED ORDER — CHLORHEXIDINE GLUCONATE 4 % EX LIQD: 60.0000 mL | Freq: Once | CUTANEOUS | Status: DC

## 2019-02-17 MED ORDER — PANTOPRAZOLE SODIUM 20 MG PO TBEC
20.0000 mg | DELAYED_RELEASE_TABLET | Freq: Every day | ORAL | Status: DC
  Administered 2019-02-18: 20 mg via ORAL
  Filled 2019-02-17: qty 1

## 2019-02-17 MED ORDER — METHOCARBAMOL 500 MG PO TABS
500.0000 mg | ORAL_TABLET | Freq: Four times a day (QID) | ORAL | Status: DC | PRN
  Administered 2019-02-17 – 2019-02-18 (×4): 500 mg via ORAL
  Filled 2019-02-17 (×4): qty 1

## 2019-02-17 MED ORDER — PROPOFOL 10 MG/ML IV BOLUS
INTRAVENOUS | Status: DC | PRN
  Administered 2019-02-17: 30 mg via INTRAVENOUS

## 2019-02-17 MED ORDER — PROPOFOL 10 MG/ML IV BOLUS
INTRAVENOUS | Status: AC
  Filled 2019-02-17: qty 20

## 2019-02-17 MED ORDER — FENTANYL CITRATE (PF) 100 MCG/2ML IJ SOLN
INTRAMUSCULAR | Status: DC | PRN
  Administered 2019-02-17 (×2): 50 ug via INTRAVENOUS

## 2019-02-17 MED ORDER — ONDANSETRON HCL 4 MG/2ML IJ SOLN: 4.0000 mg | Freq: Four times a day (QID) | INTRAMUSCULAR | Status: DC | PRN

## 2019-02-17 MED ORDER — ASPIRIN EC 325 MG PO TBEC: 325.0000 mg | DELAYED_RELEASE_TABLET | Freq: Every day | ORAL | 0 refills | Status: DC

## 2019-02-17 MED ORDER — DEXAMETHASONE SODIUM PHOSPHATE 10 MG/ML IJ SOLN
INTRAMUSCULAR | Status: DC | PRN
  Administered 2019-02-17: 10 mg via INTRAVENOUS

## 2019-02-17 MED ORDER — BUPIVACAINE LIPOSOME 1.3 % IJ SUSP
20.0000 mL | Freq: Once | INTRAMUSCULAR | Status: DC
  Filled 2019-02-17: qty 20

## 2019-02-17 MED ORDER — METOCLOPRAMIDE HCL 5 MG/ML IJ SOLN: 5.0000 mg | Freq: Three times a day (TID) | INTRAMUSCULAR | Status: DC | PRN

## 2019-02-17 MED ORDER — DOCUSATE SODIUM 100 MG PO CAPS
100.0000 mg | ORAL_CAPSULE | Freq: Two times a day (BID) | ORAL | Status: DC
  Administered 2019-02-17 – 2019-02-18 (×2): 100 mg via ORAL
  Filled 2019-02-17 (×2): qty 1

## 2019-02-17 MED ORDER — LACTATED RINGERS IV SOLN: INTRAVENOUS | Status: DC | PRN

## 2019-02-17 MED ORDER — CEFAZOLIN SODIUM-DEXTROSE 2-4 GM/100ML-% IV SOLN
INTRAVENOUS | Status: AC
  Filled 2019-02-17: qty 100

## 2019-02-17 MED ORDER — ACETAMINOPHEN 325 MG PO TABS: 325.0000 mg | ORAL_TABLET | ORAL | Status: DC | PRN

## 2019-02-17 MED ORDER — TRANEXAMIC ACID-NACL 1000-0.7 MG/100ML-% IV SOLN
INTRAVENOUS | Status: DC | PRN
  Administered 2019-02-17: 1000 mg via INTRAVENOUS

## 2019-02-17 MED ORDER — MIDAZOLAM HCL 2 MG/2ML IJ SOLN: 2.0000 mg | Freq: Once | INTRAMUSCULAR | Status: AC

## 2019-02-17 MED ORDER — SODIUM CHLORIDE 0.9 % IV SOLN: INTRAVENOUS | Status: DC

## 2019-02-17 MED ORDER — MEPERIDINE HCL 25 MG/ML IJ SOLN: 6.2500 mg | INTRAMUSCULAR | Status: DC | PRN

## 2019-02-17 MED ORDER — OXYCODONE HCL 5 MG PO TABS: 5.0000 mg | ORAL_TABLET | Freq: Once | ORAL | Status: DC | PRN

## 2019-02-17 SURGICAL SUPPLY — 73 items
ATTUNE MED DOME PAT 38 KNEE (Knees) ×2 IMPLANT
ATTUNE MED DOME PAT 38MM KNEE (Knees) ×1 IMPLANT
ATTUNE PS FEM LT SZ 7 CEM KNEE (Femur) ×3 IMPLANT
ATTUNE PSRP INSR SZ7 5 KNEE (Insert) ×2 IMPLANT
ATTUNE PSRP INSR SZ7 5MM KNEE (Insert) ×1 IMPLANT
BANDAGE ESMARK 6X9 LF (GAUZE/BANDAGES/DRESSINGS) ×1 IMPLANT
BASE TIBIAL ROT PLAT SZ 7 KNEE (Knees) ×1 IMPLANT
BENZOIN TINCTURE PRP APPL 2/3 (GAUZE/BANDAGES/DRESSINGS) ×3 IMPLANT
BLADE SAGITTAL 25.0X1.19X90 (BLADE) ×2 IMPLANT
BLADE SAGITTAL 25.0X1.19X90MM (BLADE) ×1
BLADE SAW SGTL 13X75X1.27 (BLADE) ×3 IMPLANT
BNDG ELASTIC 4X5.8 VLCR STR LF (GAUZE/BANDAGES/DRESSINGS) ×3 IMPLANT
BNDG ELASTIC 6X5.8 VLCR STR LF (GAUZE/BANDAGES/DRESSINGS) ×3 IMPLANT
BNDG ESMARK 6X9 LF (GAUZE/BANDAGES/DRESSINGS) ×3
BOWL SMART MIX CTS (DISPOSABLE) ×3 IMPLANT
CEMENT HV SMART SET (Cement) ×6 IMPLANT
COVER SURGICAL LIGHT HANDLE (MISCELLANEOUS) ×3 IMPLANT
COVER WAND RF STERILE (DRAPES) ×3 IMPLANT
CUFF TOURN SGL QUICK 34 (TOURNIQUET CUFF) ×2
CUFF TOURN SGL QUICK 42 (TOURNIQUET CUFF) IMPLANT
CUFF TRNQT CYL 34X4.125X (TOURNIQUET CUFF) ×1 IMPLANT
DRAPE ORTHO SPLIT 77X108 STRL (DRAPES) ×4
DRAPE SURG ORHT 6 SPLT 77X108 (DRAPES) ×2 IMPLANT
DRAPE U-SHAPE 47X51 STRL (DRAPES) ×3 IMPLANT
DRSG PAD ABDOMINAL 8X10 ST (GAUZE/BANDAGES/DRESSINGS) ×3 IMPLANT
DRSG XEROFORM 1X8 (GAUZE/BANDAGES/DRESSINGS) ×3 IMPLANT
DURAPREP 26ML APPLICATOR (WOUND CARE) ×6 IMPLANT
ELECT REM PT RETURN 9FT ADLT (ELECTROSURGICAL) ×3
ELECTRODE REM PT RTRN 9FT ADLT (ELECTROSURGICAL) ×1 IMPLANT
EVACUATOR 1/8 PVC DRAIN (DRAIN) IMPLANT
FACESHIELD WRAPAROUND (MASK) ×6 IMPLANT
GAUZE SPONGE 4X4 12PLY STRL (GAUZE/BANDAGES/DRESSINGS) ×3 IMPLANT
GAUZE XEROFORM 5X9 LF (GAUZE/BANDAGES/DRESSINGS) ×3 IMPLANT
GLOVE BIOGEL PI IND STRL 8 (GLOVE) ×2 IMPLANT
GLOVE BIOGEL PI INDICATOR 8 (GLOVE) ×4
GLOVE ORTHO TXT STRL SZ7.5 (GLOVE) ×6 IMPLANT
GOWN STRL REUS W/ TWL LRG LVL3 (GOWN DISPOSABLE) ×1 IMPLANT
GOWN STRL REUS W/ TWL XL LVL3 (GOWN DISPOSABLE) ×1 IMPLANT
GOWN STRL REUS W/TWL 2XL LVL3 (GOWN DISPOSABLE) ×3 IMPLANT
GOWN STRL REUS W/TWL LRG LVL3 (GOWN DISPOSABLE) ×2
GOWN STRL REUS W/TWL XL LVL3 (GOWN DISPOSABLE) ×2
HANDPIECE INTERPULSE COAX TIP (DISPOSABLE) ×2
IMMOBILIZER KNEE 22 UNIV (SOFTGOODS) ×3 IMPLANT
KIT BASIN OR (CUSTOM PROCEDURE TRAY) ×3 IMPLANT
KIT TURNOVER KIT B (KITS) ×3 IMPLANT
MANIFOLD NEPTUNE II (INSTRUMENTS) ×3 IMPLANT
MARKER SKIN DUAL TIP RULER LAB (MISCELLANEOUS) ×3 IMPLANT
NEEDLE 18GX1X1/2 (RX/OR ONLY) (NEEDLE) ×3 IMPLANT
NEEDLE HYPO 25GX1X1/2 BEV (NEEDLE) ×3 IMPLANT
NS IRRIG 1000ML POUR BTL (IV SOLUTION) ×3 IMPLANT
PACK TOTAL JOINT (CUSTOM PROCEDURE TRAY) ×3 IMPLANT
PAD ARMBOARD 7.5X6 YLW CONV (MISCELLANEOUS) ×6 IMPLANT
PAD CAST 4YDX4 CTTN HI CHSV (CAST SUPPLIES) ×1 IMPLANT
PADDING CAST COTTON 4X4 STRL (CAST SUPPLIES) ×2
PADDING CAST COTTON 6X4 STRL (CAST SUPPLIES) ×3 IMPLANT
PIN STEINMAN FIXATION KNEE (PIN) ×3 IMPLANT
SET HNDPC FAN SPRY TIP SCT (DISPOSABLE) ×1 IMPLANT
STAPLER VISISTAT 35W (STAPLE) IMPLANT
SUCTION FRAZIER HANDLE 10FR (MISCELLANEOUS) ×2
SUCTION TUBE FRAZIER 10FR DISP (MISCELLANEOUS) ×1 IMPLANT
SUT VIC AB 0 CT1 27 (SUTURE) ×2
SUT VIC AB 0 CT1 27XBRD ANBCTR (SUTURE) ×1 IMPLANT
SUT VIC AB 1 CTX 36 (SUTURE) ×4
SUT VIC AB 1 CTX36XBRD ANBCTR (SUTURE) ×2 IMPLANT
SUT VIC AB 2-0 CT1 27 (SUTURE) ×4
SUT VIC AB 2-0 CT1 TAPERPNT 27 (SUTURE) ×2 IMPLANT
SUT VIC AB 3-0 X1 27 (SUTURE) ×3 IMPLANT
SYR 50ML LL SCALE MARK (SYRINGE) ×3 IMPLANT
SYR CONTROL 10ML LL (SYRINGE) ×3 IMPLANT
TIBIAL BASE ROT PLAT SZ 7 KNEE (Knees) ×3 IMPLANT
TOWEL GREEN STERILE (TOWEL DISPOSABLE) ×3 IMPLANT
TOWEL GREEN STERILE FF (TOWEL DISPOSABLE) ×3 IMPLANT
TRAY CATH 16FR W/PLASTIC CATH (SET/KITS/TRAYS/PACK) IMPLANT

## 2019-02-17 NOTE — Evaluation (Signed)
Physical Therapy Evaluation Patient Details Name: Erik Ayers MRN: [id number] DOB: [birth year] Today's Date: 02/17/2019   History of Present Illness  Pt is a 64 y/o male s/p L TKA. PMH includes HTN and migraines.   Clinical Impression  Pt is s/p surgery above with deficits below. Pt requiring min A to stand using RW. Pt with L knee buckling in standing and reports numbness, so further mobility deferred. Performed supine HEP and educated about knee precautions. Will continue to follow acutely to maximize functional mobility independence and safety.     Follow Up Recommendations Follow surgeon's recommendation for DC plan and follow-up therapies    Equipment Recommendations  Rolling walker with 5" wheels    Recommendations for Other Services       Precautions / Restrictions Precautions Precautions: Knee Precaution Booklet Issued: No Precaution Comments: Verbally reviewed knee precautions.  Required Braces or Orthoses: Knee Immobilizer - Left Knee Immobilizer - Left: On except when in CPM(or with PT ) Restrictions Weight Bearing Restrictions: Yes LLE Weight Bearing: Weight bearing as tolerated      Mobility  Bed Mobility Overal bed mobility: Needs Assistance Bed Mobility: Supine to Sit     Supine to sit: Supervision     General bed mobility comments: Supervision for safety. Some dizziness reported upon sitting, that subsided.   Transfers Overall transfer level: Needs assistance Equipment used: Rolling walker (2 wheeled) Transfers: Sit to/from Stand Sit to Stand: Min assist         General transfer comment: Min A for steadying assist. Pt with L knee buckling in standing, even with use of L knee immobilizer, therefore further mobility deferred.   Ambulation/Gait                Stairs            Wheelchair Mobility    Modified Rankin (Stroke Patients Only)       Balance Overall balance assessment: Needs assistance Sitting-balance support: No  upper extremity supported;Feet supported Sitting balance-Leahy Scale: Good     Standing balance support: Bilateral upper extremity supported;During functional activity Standing balance-Leahy Scale: Poor Standing balance comment: Reliant on BUE support                              Pertinent Vitals/Pain Pain Assessment: Faces Faces Pain Scale: Hurts a little bit Pain Location: L knee Pain Descriptors / Indicators: Aching;Operative site guarding Pain Intervention(s): Limited activity within patient's tolerance;Monitored during session;Repositioned    Home Living Family/patient expects to be discharged to:: Private residence Living Arrangements: Spouse/significant other;Children Available Help at Discharge: Family Type of Home: House Home Access: Stairs to enter Entrance Stairs-Rails: None Entrance Stairs-Number of Steps: 3 Home Layout: One level Home Equipment: Walker - standard;Bedside commode      Prior Function Level of Independence: Independent               Hand Dominance        Extremity/Trunk Assessment   Upper Extremity Assessment Upper Extremity Assessment: Defer to OT evaluation    Lower Extremity Assessment Lower Extremity Assessment: LLE deficits/detail LLE Deficits / Details: Reports some numbness in LLE. Knee extensor lag noted when performing SLR.     Cervical / Trunk Assessment Cervical / Trunk Assessment: Normal  Communication   Communication: No difficulties  Cognition Arousal/Alertness: Awake/alert Behavior During Therapy: WFL for tasks assessed/performed Overall Cognitive Status: Within Functional Limits for tasks assessed  General Comments General comments (skin integrity, edema, etc.): Pt's wife present during session.     Exercises Total Joint Exercises Ankle Circles/Pumps: AROM;Both;20 reps;Supine Quad Sets: AROM;Left;10 reps;Supine   Assessment/Plan    PT  Assessment Patient needs continued PT services  PT Problem List Decreased strength;Decreased activity tolerance;Decreased balance;Decreased mobility;Decreased knowledge of use of DME;Decreased knowledge of precautions;Pain;Impaired sensation       PT Treatment Interventions Stair training;Gait training;DME instruction;Functional mobility training;Therapeutic activities;Therapeutic exercise;Balance training;Patient/family education    PT Goals (Current goals can be found in the Care Plan section)  Acute Rehab PT Goals Patient Stated Goal: to go home PT Goal Formulation: With patient Time For Goal Achievement: 03/03/19 Potential to Achieve Goals: Good    Frequency 7X/week   Barriers to discharge        Co-evaluation               AM-PAC PT "6 Clicks" Mobility  Outcome Measure Help needed turning from your back to your side while in a flat bed without using bedrails?: None Help needed moving from lying on your back to sitting on the side of a flat bed without using bedrails?: A Little Help needed moving to and from a bed to a chair (including a wheelchair)?: A Little Help needed standing up from a chair using your arms (e.g., wheelchair or bedside chair)?: A Little Help needed to walk in hospital room?: A Little Help needed climbing 3-5 steps with a railing? : A Lot 6 Click Score: 18    End of Session Equipment Utilized During Treatment: Gait belt Activity Tolerance: Patient tolerated treatment well Patient left: in bed;with call bell/phone within reach;with family/visitor present;Other (comment)(sitting EOB ) Nurse Communication: Mobility status PT Visit Diagnosis: Unsteadiness on feet (R26.81);Muscle weakness (generalized) (M62.81);Difficulty in walking, not elsewhere classified (R26.2);Pain Pain - Right/Left: Left Pain - part of body: Knee    Time: 6578-4696 PT Time Calculation (min) (ACUTE ONLY): 23 min   Charges:   PT Evaluation $PT Eval Low Complexity: 1  Low PT Treatments $Therapeutic Activity: 8-22 mins        Cindee Salt, DPT  Acute Rehabilitation Services  Pager: [phone] Office: [phone]   Lehman Prom 02/17/2019, 4:36 PM

## 2019-02-17 NOTE — Op Note (Signed)
Preop diagnosis: Left knee primary osteoarthritis  Postop diagnosis: Same  Procedure: Left cemented total knee arthroplasty.  Surgeon: Annell Greening, MD  Assistant: Zonia Kief, PA-C medically necessary and present for the entire procedure  Anesthesia: Preoperative block, spinal anesthesia converted to general.  Plus Marcaine Exparel 20+20 = 40.  Drains: None  Implants:Depuy #7 Attune femur #7 tibia 5 mm thick rotating platform.  38 mm 3 peg dome patella.  Procedure: After preoperative block spinal with partial numbness still some motor anesthesia converted to general and proximal thigh tourniquet standard prepping and draping for total knee arthroplasty was performed leg was wrapped in Esmarch tourniquet timeout procedure completed Ancef prophylactically.  Midline incision was made medial parapatellar incision.  Patella everted 10 mm resected.  Sizing for the femur was a 7.  10 mm resected from the femur 10 off the tibia.  5 mm spacer block gave full extension.  Chamfer cuts box cut were made on the femur sizing on the tibia was a 7 which was placed.  Trials were inserted lug nuts were drilled 38 mm size on the patella.  Pulse lavage resection of all meniscal remnants.  Patient had tricompartmental degenerative changes marginal osteophytes and exposed eburnated bone noted initial arthrotomy.  Vacuum mixing of the cement pulse lavage preparation the bone.  Tibia cemented first followed by femur placement of the permanent rotating platform and then patella.  Cement was hard at 15 minutes tourniquet deflated hemostasis is obtained in standard layered closure with skin staple closure.  Instrument count needle count was correct.  #1 Vicryl was placed in the deep capsule ~subtendinous tissue and postop dressing.  Instrument count needle count was correct.  Marcaine was infiltrated while cement was hardening and prior to closure with Exparel 20+20 = 40 cc total.

## 2019-02-17 NOTE — Discharge Instructions (Addendum)

## 2019-02-17 NOTE — H&P (Signed)
TOTAL KNEE ADMISSION H&P  Patient is being admitted for left total knee arthroplasty.  Subjective:  Chief Complaint:left knee pain.  HPI: Erik Ayers, 64 y.o. male, has a history of pain and functional disability in the left knee due to arthritis and has failed non-surgical conservative treatments for greater than 12 weeks to includeNSAID's and/or analgesics, corticosteriod injections, use of assistive devices and activity modification.  Onset of symptoms was gradual, starting 10 years ago with gradually worsening course since that time.   Patient currently rates pain in the left knee(s) at 10 out of 10 with activity. Patient has night pain, worsening of pain with activity and weight bearing, pain that interferes with activities of daily living, pain with passive range of motion, crepitus and joint swelling.  Patient has evidence of subchondral cysts, subchondral sclerosis, periarticular osteophytes and joint space narrowing by imaging studies.  There is no active infection.  Patient Active Problem List   Diagnosis Date Noted  . Unilateral primary osteoarthritis, left knee 01/05/2019  . Knee locking, left 12/15/2018   Past Medical History:  Diagnosis Date  . Alcohol abuse   . Arthritis   . Depression   . GERD (gastroesophageal reflux disease)   . H/O bladder problems   . Hypertension   . Migraines     Past Surgical History:  Procedure Laterality Date  . COLONOSCOPY    . HERNIA REPAIR      No current facility-administered medications for this encounter.   Current Outpatient Medications  Medication Sig Dispense Refill Last Dose  . atorvastatin (LIPITOR) 40 MG tablet Take 40 mg by mouth daily.     Marland Kitchen docusate sodium (COLACE) 100 MG capsule Take 100 mg by mouth daily.      . hydrochlorothiazide (HYDRODIURIL) 12.5 MG tablet Take 12.5 mg by mouth daily.     . lansoprazole (PREVACID) 30 MG capsule Take 30 mg by mouth daily as needed (acid reflux).      . Multiple Vitamin  (MULTIVITAMIN WITH MINERALS) TABS tablet Take 1 tablet by mouth daily.     Marland Kitchen olmesartan (BENICAR) 40 MG tablet Take 40 mg by mouth daily.     . Omega-3 Fatty Acids (FISH OIL) 1000 MG CAPS Take 1,000 mg by mouth 3 (three) times daily.     . traMADol (ULTRAM) 50 MG tablet Take 1 tablet (50 mg total) by mouth every 12 (twelve) hours as needed. 30 tablet 0    No Known Allergies  Social History   Tobacco Use  . Smoking status: Former Smoker    Quit date: 2020    Years since quitting: 1.1  . Smokeless tobacco: Never Used  Substance Use Topics  . Alcohol use: Yes    Comment: ONCE PER MONTH    No family history on file.   Review of Systems  Constitutional: Negative.   HENT: Negative.   Respiratory: Negative.   Cardiovascular: Negative.   Genitourinary: Negative.   Musculoskeletal: Positive for gait problem and joint swelling.  Skin: Negative.   Psychiatric/Behavioral: Negative.     Objective:  Physical Exam  Constitutional: He is oriented to person, place, and time. He appears well-developed. No distress.  HENT:  Head: Normocephalic.  Eyes: Pupils are equal, round, and reactive to light. EOM are normal.  Cardiovascular: Normal rate.  GI: Soft. He exhibits no distension. There is no abdominal tenderness.  Musculoskeletal:        General: Tenderness present.     Cervical back: Normal range of motion.  Neurological:  He is alert and oriented to person, place, and time.  Skin: Skin is warm and dry.  Psychiatric: He has a normal mood and affect.    Vital signs in last 24 hours:    Labs:   Estimated body mass index is 31.42 kg/m as calculated from the following:   Height as of 02/09/19: 5\' 10"  (1.778 m).   Weight as of 02/09/19: 99.3 kg.   Imaging Review Plain radiographs demonstrate moderate degenerative joint disease of the left knee(s). The overall alignment ismild varus. The bone quality appears to be good for age and reported activity  level.      Assessment/Plan:  End stage arthritis, left knee   The patient history, physical examination, clinical judgment of the provider and imaging studies are consistent with end stage degenerative joint disease of the left knee(s) and total knee arthroplasty is deemed medically necessary. The treatment options including medical management, injection therapy arthroscopy and arthroplasty were discussed at length. The risks and benefits of total knee arthroplasty were presented and reviewed. The risks due to aseptic loosening, infection, stiffness, patella tracking problems, thromboembolic complications and other imponderables were discussed. The patient acknowledged the explanation, agreed to proceed with the plan and consent was signed. Patient is being admitted for inpatient treatment for surgery, pain control, PT, OT, prophylactic antibiotics, VTE prophylaxis, progressive ambulation and ADL's and discharge planning. The patient is planning to be discharged home with home health services    Anticipated LOS equal to or greater than 2 midnights due to - Age 73 and older with one or more of the following:  - Obesity  - Expected need for hospital services (PT, OT, Nursing) required for safe  discharge  - Anticipated need for postoperative skilled nursing care or inpatient rehab  - Active co-morbidities: None OR   - Unanticipated findings during/Post Surgery: None  - Patient is a high risk of re-admission due to: None

## 2019-02-17 NOTE — Progress Notes (Signed)
Orthopedic Tech Progress Note Patient Details:  Erik Ayers December 12, 1955 [id number]  CPM Left Knee CPM Left Knee: On Left Knee Flexion (Degrees): 90 Left Knee Extension (Degrees): 0 Additional Comments: Trapeze bar and foot roll  Post Interventions Patient Tolerated: Well Instructions Provided: Care of device  Saul Fordyce 02/17/2019, 12:49 PM

## 2019-02-17 NOTE — Anesthesia Postprocedure Evaluation (Signed)
Anesthesia Post Note  Patient: Erik Ayers  Procedure(s) Performed: LEFT TOTAL KNEE ARTHROPLASTY (Left Knee)     Patient location during evaluation: PACU Anesthesia Type: MAC and Spinal Level of consciousness: awake and alert Pain management: pain level controlled Vital Signs Assessment: post-procedure vital signs reviewed and stable Respiratory status: spontaneous breathing, nonlabored ventilation, respiratory function stable and patient connected to nasal cannula oxygen Cardiovascular status: stable and blood pressure returned to baseline Postop Assessment: no apparent nausea or vomiting Anesthetic complications: no    Last Vitals:  Vitals:   02/17/19 1306 02/17/19 1323  BP: 115/86 122/80  Pulse: 85 86  Resp: (!) 21 16  Temp:  36.8 C  SpO2: 97% 100%    Last Pain:  Vitals:   02/17/19 1323  TempSrc: Oral  PainSc:                  [name]

## 2019-02-17 NOTE — Interval H&P Note (Signed)
History and Physical Interval Note:  02/17/2019 9:06 AM  Erik Ayers  has presented today for surgery, with the diagnosis of Left knee osteoarthritis.  The various methods of treatment have been discussed with the patient and family. After consideration of risks, benefits and other options for treatment, the patient has consented to  Procedure(s): LEFT TOTAL KNEE ARTHROPLASTY (Left) as a surgical intervention.  The patient's history has been reviewed, patient examined, no change in status, stable for surgery.  I have reviewed the patient's chart and labs.  Questions were answered to the patient's satisfaction.     Eldred Manges

## 2019-02-17 NOTE — Anesthesia Procedure Notes (Addendum)
Anesthesia Regional Block: Adductor canal block   Pre-Anesthetic Checklist: ,, timeout performed, Correct Patient, Correct Site, Correct Laterality, Correct Procedure, Correct Position, site marked, Risks and benefits discussed,  Surgical consent,  Pre-op evaluation,  At surgeon's request and post-op pain management  Laterality: Left  Prep: chloraprep       Needles:  Injection technique: Single-shot  Needle Type: Echogenic Stimulator Needle     Needle Length: 5cm  Needle Gauge: 22     Additional Needles:   Procedures:, nerve stimulator,,, ultrasound used (permanent image in chart),,,,  Narrative:  Start time: 02/17/2019 9:10 AM End time: 02/17/2019 9:25 AM Injection made incrementally with aspirations every 5 mL.  Performed by: Personally  Anesthesiologist: Bethena Midget, MD  Additional Notes: Functioning IV was confirmed and monitors were applied.  A 35mm 22ga Arrow echogenic stimulator needle was used. Sterile prep and drape,hand hygiene and sterile gloves were used. Ultrasound guidance: relevant anatomy identified, needle position confirmed, local anesthetic spread visualized around nerve(s)., vascular puncture avoided.  Image printed for medical record. Negative aspiration and negative test dose prior to incremental administration of local anesthetic. The patient tolerated the procedure well.

## 2019-02-17 NOTE — Anesthesia Procedure Notes (Signed)
Procedure Name: LMA Insertion Date/Time: 02/17/2019 10:24 AM Performed by: Lovie Chol, CRNA Pre-anesthesia Checklist: Patient identified, Emergency Drugs available, Suction available and Patient being monitored Patient Re-evaluated:Patient Re-evaluated prior to induction Oxygen Delivery Method: Circle System Utilized Preoxygenation: Pre-oxygenation with 100% oxygen Induction Type: IV induction Ventilation: Mask ventilation without difficulty LMA: LMA inserted LMA Size: 4.0 Number of attempts: 1 Airway Equipment and Method: Bite block Placement Confirmation: positive ETCO2 and breath sounds checked- equal and bilateral Tube secured with: Tape Dental Injury: Teeth and Oropharynx as per pre-operative assessment

## 2019-02-17 NOTE — Transfer of Care (Signed)
Immediate Anesthesia Transfer of Care Note  Patient: Erik Ayers  Procedure(s) Performed: LEFT TOTAL KNEE ARTHROPLASTY (Left Knee)  Patient Location: PACU  Anesthesia Type:General and Spinal  Level of Consciousness: awake, oriented and patient cooperative  Airway & Oxygen Therapy: Patient Spontanous Breathing and Patient connected to nasal cannula oxygen  Post-op Assessment: Report given to RN and Post -op Vital signs reviewed and stable  Post vital signs: Reviewed  Last Vitals:  Vitals Value Taken Time  BP 128/77 02/17/19 1217  Temp    Pulse 94 02/17/19 1218  Resp 17 02/17/19 1218  SpO2 96 % 02/17/19 1218  Vitals shown include unvalidated device data.  Last Pain:  Vitals:   02/17/19 0915  PainSc: 0-No pain      Patients Stated Pain Goal: 3 (02/17/19 0817)  Complications: No apparent anesthesia complications

## 2019-02-17 NOTE — Anesthesia Procedure Notes (Signed)
Spinal  Patient location during procedure: OR Start time: 02/17/2019 10:10 AM End time: 02/17/2019 10:15 AM Staffing Anesthesiologist: Bethena Midget, MD Preanesthetic Checklist Completed: patient identified, IV checked, site marked, risks and benefits discussed, surgical consent, monitors and equipment checked, pre-op evaluation and timeout performed Spinal Block Patient position: sitting Prep: DuraPrep Patient monitoring: heart rate, cardiac monitor, continuous pulse ox and blood pressure Approach: midline Location: L3-4 Injection technique: single-shot Needle Needle type: Sprotte and Whitacre  Needle gauge: 22 G Needle length: 9 cm Assessment Sensory level: T4 Additional Notes Difficulty so changed to 22g whitacre with difficulty.  Change to GA/LMA

## 2019-02-17 NOTE — Plan of Care (Signed)

## 2019-02-17 NOTE — Plan of Care (Signed)

## 2019-02-18 DIAGNOSIS — M1712 Unilateral primary osteoarthritis, left knee: Secondary | ICD-10-CM | POA: Diagnosis not present

## 2019-02-18 LAB — CBC
HCT: 38.7 % — ABNORMAL LOW (ref 39.0–52.0)
Hemoglobin: 13.4 g/dL (ref 13.0–17.0)
MCH: 32.8 pg (ref 26.0–34.0)
MCHC: 34.6 g/dL (ref 30.0–36.0)
MCV: 94.9 fL (ref 80.0–100.0)
Platelets: 221 10*3/uL (ref 150–400)
RBC: 4.08 MIL/uL — ABNORMAL LOW (ref 4.22–5.81)
RDW: 12.6 % (ref 11.5–15.5)
WBC: 16.8 10*3/uL — ABNORMAL HIGH (ref 4.0–10.5)
nRBC: 0 % (ref 0.0–0.2)

## 2019-02-18 LAB — BASIC METABOLIC PANEL
Anion gap: 8 (ref 5–15)
BUN: 10 mg/dL (ref 8–23)
CO2: 24 mmol/L (ref 22–32)
Calcium: 8.8 mg/dL — ABNORMAL LOW (ref 8.9–10.3)
Chloride: 105 mmol/L (ref 98–111)
Creatinine, Ser: 0.9 mg/dL (ref 0.61–1.24)
GFR calc Af Amer: >60 mL/min (ref >60)
GFR calc non Af Amer: >60 mL/min (ref >60)
Glucose, Bld: 118 mg/dL — ABNORMAL HIGH (ref 70–99)
Potassium: 4.6 mmol/L (ref 3.5–5.1)
Sodium: 137 mmol/L (ref 135–145)

## 2019-02-18 MED ORDER — OXYCODONE HCL 5 MG PO TABS
10.0000 mg | ORAL_TABLET | ORAL | Status: DC | PRN
  Administered 2019-02-18: 15 mg via ORAL
  Filled 2019-02-18: qty 3

## 2019-02-18 NOTE — Progress Notes (Signed)
Physical Therapy Treatment Patient Details Name: Erik Ayers MRN: [id number] DOB: 09-10-1955 Today's Date: 02/18/2019    History of Present Illness Pt is a 64 y/o male s/p L TKA. PMH includes HTN and migraines.     PT Comments    Pt progressing nicely and plan is to d/c home today. Pt reporting that his steps to enter his home are 1-1.5' tall and that he has planned to back up to them and sit down on the top on and have someone assist him into a standing position. Pt reported that he has already arranged for his neighbor to be there when he gets home today. Pt also reported that he practiced it this way prior to surgery. Pt would continue to benefit from skilled physical therapy services at this time while admitted and after d/c to address the below listed limitations in order to improve overall safety and independence with functional mobility.    Follow Up Recommendations  Home health PT     Equipment Recommendations  Rolling walker with 5" wheels    Recommendations for Other Services       Precautions / Restrictions Precautions Precautions: Knee Precaution Booklet Issued: Yes (comment) Precaution Comments: Verbally reviewed knee precautions.  Restrictions Weight Bearing Restrictions: Yes LLE Weight Bearing: Weight bearing as tolerated    Mobility  Bed Mobility Overal bed mobility: Needs Assistance Bed Mobility: Supine to Sit;Sit to Supine     Supine to sit: Supervision Sit to supine: Supervision   General bed mobility comments: no physical assistance needed  Transfers Overall transfer level: Needs assistance Equipment used: Rolling walker (2 wheeled) Transfers: Sit to/from Stand Sit to Stand: Supervision         General transfer comment: good technique, supervision for safety  Ambulation/Gait Ambulation/Gait assistance: Min guard Gait Distance (Feet): 75 Feet Assistive device: Rolling walker (2 wheeled) Gait Pattern/deviations: Step-to pattern;Decreased  step length - right;Decreased step length - left;Decreased stance time - left;Decreased stride length;Decreased weight shift to left Gait velocity: decreased   General Gait Details: pt overall steady with RW, min guard for safety, no LOB or knee buckling noted   Stairs             Wheelchair Mobility    Modified Rankin (Stroke Patients Only)       Balance Overall balance assessment: Needs assistance Sitting-balance support: No upper extremity supported;Feet supported Sitting balance-Leahy Scale: Good     Standing balance support: Bilateral upper extremity supported;During functional activity Standing balance-Leahy Scale: Poor                              Cognition Arousal/Alertness: Awake/alert Behavior During Therapy: WFL for tasks assessed/performed Overall Cognitive Status: Within Functional Limits for tasks assessed                                        Exercises Total Joint Exercises Quad Sets: AROM;Strengthening;Left;10 reps;Seated Long Arc Quad: AAROM;Left;20 reps;Seated Knee Flexion: AAROM;Left;20 reps;Seated    General Comments        Pertinent Vitals/Pain Pain Assessment: Faces Faces Pain Scale: Hurts even more Pain Location: L knee Pain Descriptors / Indicators: Aching;Operative site guarding Pain Intervention(s): Monitored during session;Repositioned    Home Living                      Prior Function  PT Goals (current goals can now be found in the care plan section) Acute Rehab PT Goals PT Goal Formulation: With patient Time For Goal Achievement: 03/03/19 Potential to Achieve Goals: Good Progress towards PT goals: Progressing toward goals    Frequency    7X/week      PT Plan Current plan remains appropriate    Co-evaluation              AM-PAC PT "6 Clicks" Mobility   Outcome Measure  Help needed turning from your back to your side while in a flat bed without using  bedrails?: None Help needed moving from lying on your back to sitting on the side of a flat bed without using bedrails?: None Help needed moving to and from a bed to a chair (including a wheelchair)?: None Help needed standing up from a chair using your arms (e.g., wheelchair or bedside chair)?: None Help needed to walk in hospital room?: A Little Help needed climbing 3-5 steps with a railing? : A Little 6 Click Score: 22    End of Session Equipment Utilized During Treatment: Gait belt Activity Tolerance: Patient tolerated treatment well Patient left: in bed;with call bell/phone within reach Nurse Communication: Mobility status PT Visit Diagnosis: Unsteadiness on feet (R26.81);Muscle weakness (generalized) (M62.81);Difficulty in walking, not elsewhere classified (R26.2);Pain Pain - Right/Left: Left Pain - part of body: Knee     Time: 4332-9518 PT Time Calculation (min) (ACUTE ONLY): 16 min  Charges:  $Gait Training: 8-22 mins                     Arletta Bale, DPT  Acute Rehabilitation Services Pager [phone] Office [phone]     Alessandra Bevels Kaylianna Detert 02/18/2019, 1:40 PM

## 2019-02-18 NOTE — Progress Notes (Signed)
Discharge instructions addressed; Pt in stable condition;Rolling Walker delivered in room;PRN pain med given prior to discharge; Pt to be pick up by spouse at the [employer] entrance.

## 2019-02-18 NOTE — TOC Transition Note (Addendum)
Transition of Care [facility]) - CM/SW Discharge Note   Patient Details  Name: Erik Ayers MRN: [id number] Date of Birth: [birth year]  Transition [facility]) CM/SW Contact:  Deveron Furlong, RN 02/18/2019, 11:49 AM   Clinical Narrative:    Patient to d/c home today.  Patient will receive a RW to take home prior to d/c.  According to [facility], patient was not set up for HHPT prior to surgery and there are no home home health agencies in network with his Teacher, early years/pre. [facility] confirms this information. Patient is advised of this and states it was the same situation when his wife had orthopedic surgery in the past.  The patient states he would not be able to go to OPPT because his wife does not drive and no other family in town.    The patient is advised to contact the orthopedic office Monday morning to inquire about assistance with [facility] arrangements. He may also contact insurance company Monday to inquire about what can be done.  The patient verbalizes understanding.      Final next level of care: Home/Self Care Barriers to Discharge: No Barriers Identified   Patient Goals and CMS Choice Patient states their goals for this hospitalization and ongoing recovery are:: to get better       Discharge Plan and Services                DME Arranged: Walker rolling DME Agency: [employer] Date DME Agency Contacted: 02/18/19 Time DME Agency Contacted: [phone] Representative spoke with at DME Agency: Ledell Noss

## 2019-02-18 NOTE — Progress Notes (Signed)
Physical Therapy Treatment Patient Details Name: Erik Ayers MRN: [id number] DOB: 11/18/1955 Today's Date: 02/18/2019    History of Present Illness Pt is a 64 y/o male s/p L TKA. PMH includes HTN and migraines.     PT Comments    Pt making steady progress with functional mobility. He tolerated ambulating a further distance this session and progressed with HEP. Plan for stair training at next session prior to d/c home.   Follow Up Recommendations  Home health PT     Equipment Recommendations  Rolling walker with 5" wheels    Recommendations for Other Services       Precautions / Restrictions Precautions Precautions: Knee Precaution Comments: Verbally reviewed knee precautions.  Restrictions Weight Bearing Restrictions: Yes LLE Weight Bearing: Weight bearing as tolerated    Mobility  Bed Mobility               General bed mobility comments: pt OOB in recliner chair upon arrival  Transfers Overall transfer level: Needs assistance Equipment used: Rolling walker (2 wheeled) Transfers: Sit to/from Stand Sit to Stand: Supervision         General transfer comment: good technique, supervision for safety  Ambulation/Gait Ambulation/Gait assistance: Min guard Gait Distance (Feet): 75 Feet Assistive device: Rolling walker (2 wheeled) Gait Pattern/deviations: Step-to pattern;Decreased step length - right;Decreased step length - left;Decreased stance time - left;Decreased stride length;Decreased weight shift to left Gait velocity: decreased   General Gait Details: pt overall steady with RW, min guard for safety, no LOB or knee buckling noted   Stairs             Wheelchair Mobility    Modified Rankin (Stroke Patients Only)       Balance Overall balance assessment: Needs assistance Sitting-balance support: No upper extremity supported;Feet supported Sitting balance-Leahy Scale: Good     Standing balance support: Bilateral upper extremity  supported;During functional activity Standing balance-Leahy Scale: Poor Standing balance comment: Reliant on BUE support                             Cognition Arousal/Alertness: Awake/alert Behavior During Therapy: WFL for tasks assessed/performed Overall Cognitive Status: Within Functional Limits for tasks assessed                                        Exercises Total Joint Exercises Ankle Circles/Pumps: AROM;Both;20 reps;Seated Quad Sets: AROM;Strengthening;Left;10 reps;Seated Long Arc Quad: AROM;Strengthening;Left;20 reps;Seated Knee Flexion: AROM;Strengthening;Left;20 reps;Seated    General Comments        Pertinent Vitals/Pain Pain Assessment: 0-10 Pain Score: 8  Pain Location: L knee Pain Descriptors / Indicators: Aching;Operative site guarding Pain Intervention(s): Monitored during session;Repositioned    Home Living                      Prior Function            PT Goals (current goals can now be found in the care plan section) Acute Rehab PT Goals PT Goal Formulation: With patient Time For Goal Achievement: 03/03/19 Potential to Achieve Goals: Good Progress towards PT goals: Progressing toward goals    Frequency    7X/week      PT Plan Current plan remains appropriate    Co-evaluation              AM-PAC PT "6 Clicks"  Mobility   Outcome Measure  Help needed turning from your back to your side while in a flat bed without using bedrails?: None Help needed moving from lying on your back to sitting on the side of a flat bed without using bedrails?: None Help needed moving to and from a bed to a chair (including a wheelchair)?: None Help needed standing up from a chair using your arms (e.g., wheelchair or bedside chair)?: None Help needed to walk in hospital room?: A Little Help needed climbing 3-5 steps with a railing? : A Little 6 Click Score: 22    End of Session Equipment Utilized During Treatment:  Gait belt Activity Tolerance: Patient tolerated treatment well Patient left: in chair;with call bell/phone within reach Nurse Communication: Mobility status PT Visit Diagnosis: Unsteadiness on feet (R26.81);Muscle weakness (generalized) (M62.81);Difficulty in walking, not elsewhere classified (R26.2);Pain Pain - Right/Left: Left Pain - part of body: Knee     Time: 1517-6160 PT Time Calculation (min) (ACUTE ONLY): 21 min  Charges:  $Gait Training: 8-22 mins                     Anastasio Champion, DPT  Acute Rehabilitation Services Pager [phone] Office [place] 02/18/2019, 9:24 AM

## 2019-02-18 NOTE — Progress Notes (Signed)
   Subjective: 1 Day Post-Op Procedure(s) (LRB): LEFT TOTAL KNEE ARTHROPLASTY (Left) Patient reports pain as moderate.    Objective: Vital signs in last 24 hours: Temp:  [98 F (36.7 C)-99.4 F (37.4 C)] 99.4 F (37.4 C) (02/13 0823) Pulse Rate:  [85-107] 107 (02/13 0823) Resp:  [14-21] 18 (02/13 0823) BP: (112-142)/(67-86) 142/72 (02/13 0823) SpO2:  [96 %-100 %] 99 % (02/13 0823)  Intake/Output from previous day: 02/12 0701 - 02/13 0700 In: 1993 [I.V.:1793; IV Piggyback:200] Out: 1051 [Urine:1001; Blood:50] Intake/Output this shift: Total I/O In: 240 [P.O.:240] Out: 400 [Urine:400]  Recent Labs    02/18/19 0252  HGB 13.4   Recent Labs    02/18/19 0252  WBC 16.8*  RBC 4.08*  HCT 38.7*  PLT 221   Recent Labs    02/18/19 0252  NA 137  K 4.6  CL 105  CO2 24  BUN 10  CREATININE 0.90  GLUCOSE 118*  CALCIUM 8.8*   No results for input(s): LABPT, INR in the last 72 hours.  Neurologically intact DG Knee 1-2 Views Left  Result Date: 02/17/2019 CLINICAL DATA:  Left knee replacement EXAM: LEFT KNEE - 1-2 VIEW COMPARISON:  None. FINDINGS: Skin staples are identified. The femoral and tibial components of the knee replacement are in good position. Postoperative air seen in the soft tissues. IMPRESSION: Left knee replacement as above. Electronically Signed   By: Gerome Sam III M.D   On: 02/17/2019 12:34    Assessment/Plan: 1 Day Post-Op Procedure(s) (LRB): LEFT TOTAL KNEE ARTHROPLASTY (Left) Up with therapy, home after does stairs today. DME walker ordered, HHPT ordered.  Eldred Manges 02/18/2019, 11:11 AM

## 2019-02-20 ENCOUNTER — Encounter: Payer: Self-pay | Admitting: *Deleted

## 2019-02-21 NOTE — Discharge Summary (Signed)
Patient ID: Erik Ayers MRN: [id number] DOB/AGE: [birth year] 64 y.o.  Admit date: 02/17/2019 Discharge date: 02/18/2019 Admission Diagnoses:  Active Problems:   Unilateral primary osteoarthritis, left knee   Arthritis of left knee   Discharge Diagnoses:  Active Problems:   Unilateral primary osteoarthritis, left knee   Arthritis of left knee  status post Procedure(s): LEFT TOTAL KNEE ARTHROPLASTY  Past Medical History:  Diagnosis Date  . Alcohol abuse   . Arthritis   . Depression   . GERD (gastroesophageal reflux disease)   . H/O bladder problems   . Hypertension   . Migraines     Surgeries: Procedure(s): LEFT TOTAL KNEE ARTHROPLASTY on 02/17/2019   Consultants:   Discharged Condition: Improved  Hospital Course: Erik Ayers is an 64 y.o. male who was admitted 02/17/2019 for operative treatment of left knee arthritis. Patient failed conservative treatments (please see the history and physical for the specifics) and had severe unremitting pain that affects sleep, daily activities and work/hobbies. After pre-op clearance, the patient was taken to the operating room on 02/17/2019 and underwent  Procedure(s): LEFT TOTAL KNEE ARTHROPLASTY.    Patient was given perioperative antibiotics:  Anti-infectives (From admission, onward)   Start     Dose/Rate Route Frequency Ordered Stop   02/17/19 0945  ceFAZolin (ANCEF) IVPB 2g/100 mL premix     2 g 200 mL/hr over 30 Minutes Intravenous On call to O.R. 02/17/19 4128 02/17/19 1017   02/17/19 0811  ceFAZolin (ANCEF) 2-4 GM/100ML-% IVPB    Note to Pharmacy: Tawanna Sat   : cabinet override      02/17/19 0811 02/17/19 1039       Patient was given sequential compression devices and early ambulation to prevent DVT.   Patient benefited maximally from hospital stay and there were no complications. At the time of discharge, the patient was urinating/moving their bowels without difficulty, tolerating a regular diet, pain is  controlled with oral pain medications and they have been cleared by PT/OT.   Recent vital signs: No data found.   Recent laboratory studies: No results for input(s): WBC, HGB, HCT, PLT, NA, K, CL, CO2, BUN, CREATININE, GLUCOSE, INR, CALCIUM in the last 72 hours.  Invalid input(s): PT, 2   Discharge Medications:   Allergies as of 02/18/2019   No Known Allergies     Medication List    STOP taking these medications   traMADol 50 MG tablet Commonly known as: ULTRAM     TAKE these medications   aspirin EC 325 MG tablet Take 1 tablet (325 mg total) by mouth daily.   atorvastatin 40 MG tablet Commonly known as: LIPITOR Take 40 mg by mouth daily.   docusate sodium 100 MG capsule Commonly known as: COLACE Take 100 mg by mouth daily.   Fish Oil 1000 MG Caps Take 1,000 mg by mouth 3 (three) times daily.   hydrochlorothiazide 12.5 MG tablet Commonly known as: HYDRODIURIL Take 12.5 mg by mouth daily.   lansoprazole 30 MG capsule Commonly known as: PREVACID Take 30 mg by mouth daily as needed (acid reflux).   methocarbamol 500 MG tablet Commonly known as: Robaxin Take 1 tablet (500 mg total) by mouth every 6 (six) hours as needed for muscle spasms.   multivitamin with minerals Tabs tablet Take 1 tablet by mouth daily.   olmesartan 40 MG tablet Commonly known as: BENICAR Take 40 mg by mouth daily.   oxyCODONE-acetaminophen 5-325 MG tablet Commonly known as: PERCOCET/ROXICET Take 1-2 tablets by mouth every  6 (six) hours as needed for severe pain.       Diagnostic Studies: DG Chest 2 View  Result Date: 02/08/2019 CLINICAL DATA:  Preop evaluation for upcoming knee surgery EXAM: CHEST - 2 VIEW COMPARISON:  None. FINDINGS: Cardiac shadows within normal limits. The lungs are hyperinflated without focal infiltrate or sizable effusion. Mild degenerative changes of thoracic spine are noted. IMPRESSION: COPD without acute abnormality. Electronically Signed   By: Inez Catalina M.D.    On: 02/08/2019 23:34   DG Knee 1-2 Views Left  Result Date: 02/17/2019 CLINICAL DATA:  Left knee replacement EXAM: LEFT KNEE - 1-2 VIEW COMPARISON:  None. FINDINGS: Skin staples are identified. The femoral and tibial components of the knee replacement are in good position. Postoperative air seen in the soft tissues. IMPRESSION: Left knee replacement as above. Electronically Signed   By: Dorise Bullion III M.D   On: 02/17/2019 12:34      Follow-up Information    Marybelle Killings, MD. Schedule an appointment as soon as possible for a visit in 1 week(s).   Specialty: Orthopedic Surgery Why: need return office visit 1 weeks postop Contact information: [address] [place] [address] [phone]           Discharge Plan:  discharge to home  Disposition:     Signed: Benjiman Core  02/21/2019, 10:29 AM

## 2019-02-22 ENCOUNTER — Telehealth: Payer: Self-pay | Admitting: Radiology

## 2019-02-22 DIAGNOSIS — Z96652 Presence of left artificial knee joint: Secondary | ICD-10-CM

## 2019-02-22 NOTE — Telephone Encounter (Signed)
Unfortunately, this is true. HHPT in this area will not take [employer] any longer. [facility] was the last one still taking and as of 01/06/19- they opted out of taking this payor. We could go ahead and get him set up for OPPT. Let me know if you need me to help. He would just need an order and location of his choice. Here may have cancellations this week if he is close.

## 2019-02-22 NOTE — Telephone Encounter (Signed)
Patient called inquiring about HHPT. He states that he was not set up for any once discharged from the hospital. He has been doing exercises at home, but is calling here to see if we may be able to get something set up for him.  I do see in the chart where the case manager stated no one was contracted with [employer] for HHPT.  Is there anyone or anything that you can recommend I do for patient?   I appreciate your help.

## 2019-02-23 NOTE — Telephone Encounter (Signed)
Patient returned call and would like to set up PT with [facility] in [place]. Rx given to office.

## 2019-02-23 NOTE — Telephone Encounter (Signed)
I left voicemail for patient advising. I asked for return call to discuss outpatient PT and  where he would like to go.

## 2019-02-23 NOTE — Addendum Note (Signed)
Addended by: Rogers Seeds on: 02/23/2019 02:25 PM   Modules accepted: Orders

## 2019-03-02 ENCOUNTER — Encounter: Payer: Self-pay | Admitting: Orthopaedic Surgery

## 2019-03-02 ENCOUNTER — Ambulatory Visit (INDEPENDENT_AMBULATORY_CARE_PROVIDER_SITE_OTHER): Payer: Managed Care, Other (non HMO) | Admitting: Orthopaedic Surgery

## 2019-03-02 ENCOUNTER — Ambulatory Visit (INDEPENDENT_AMBULATORY_CARE_PROVIDER_SITE_OTHER): Payer: Managed Care, Other (non HMO)

## 2019-03-02 VITALS — Ht 70.0 in | Wt 219.0 lb

## 2019-03-02 DIAGNOSIS — Z96652 Presence of left artificial knee joint: Secondary | ICD-10-CM | POA: Diagnosis not present

## 2019-03-02 MED ORDER — OXYCODONE-ACETAMINOPHEN 5-325 MG PO TABS: 1.0000 | ORAL_TABLET | Freq: Four times a day (QID) | ORAL | 0 refills | Status: DC | PRN

## 2019-03-02 NOTE — Progress Notes (Signed)
   Post-Op Visit Note   Patient: Erik Ayers           Date of Birth: Feb 08, 1955           MRN: [id number] Visit Date: 03/02/2019 PCP: Toma Deiters, MD   Assessment & Plan: Post left total knee arthroplasty is 85 degrees flexion and about 5 degrees lacking extension.  He will work on flexion extension.  We gave him additional exercises.  Percocet renewed recheck 4 weeks.  Chief Complaint:  Chief Complaint  Patient presents with  . Left Knee - Routine Post Op    02/17/2019 Left TKA   Visit Diagnoses:  1. Status post total left knee replacement     Plan: Continue outpatient therapy return visit 4 weeks.  Staples harvested today.  Follow-Up Instructions: No follow-ups on file.   Orders:  Orders Placed This Encounter  Procedures  . XR Knee 1-2 Views Left   No orders of the defined types were placed in this encounter.   Imaging: No results found.  PMFS History: Patient Active Problem List   Diagnosis Date Noted  . Arthritis of left knee 02/17/2019  . Unilateral primary osteoarthritis, left knee 01/05/2019  . Knee locking, left 12/15/2018   Past Medical History:  Diagnosis Date  . Alcohol abuse   . Arthritis   . Depression   . GERD (gastroesophageal reflux disease)   . H/O bladder problems   . Hypertension   . Migraines     No family history on file.  Past Surgical History:  Procedure Laterality Date  . COLONOSCOPY    . HERNIA REPAIR    . TOTAL KNEE ARTHROPLASTY Left 02/17/2019   Procedure: LEFT TOTAL KNEE ARTHROPLASTY;  Surgeon: Eldred Manges, MD;  Location: [facility] OR;  Service: Orthopedics;  Laterality: Left;   Social History   Occupational History  . Not on file  Tobacco Use  . Smoking status: Former Smoker    Quit date: 2020    Years since quitting: 1.1  . Smokeless tobacco: Never Used  Substance and Sexual Activity  . Alcohol use: Yes    Comment: ONCE PER MONTH  . Drug use: Never  . Sexual activity: Not on file

## 2019-03-12 ENCOUNTER — Other Ambulatory Visit: Payer: Self-pay | Admitting: Surgery

## 2019-03-13 NOTE — Telephone Encounter (Signed)
[facility] patient

## 2019-03-13 NOTE — Telephone Encounter (Signed)
Please advise 

## 2019-03-30 ENCOUNTER — Encounter: Payer: Self-pay | Admitting: Orthopaedic Surgery

## 2019-03-30 ENCOUNTER — Ambulatory Visit (INDEPENDENT_AMBULATORY_CARE_PROVIDER_SITE_OTHER): Payer: Managed Care, Other (non HMO) | Admitting: Orthopaedic Surgery

## 2019-03-30 VITALS — Ht 70.0 in | Wt 219.0 lb

## 2019-03-30 DIAGNOSIS — M1712 Unilateral primary osteoarthritis, left knee: Secondary | ICD-10-CM

## 2019-03-30 DIAGNOSIS — M7741 Metatarsalgia, right foot: Secondary | ICD-10-CM | POA: Insufficient documentation

## 2019-03-30 DIAGNOSIS — Z96652 Presence of left artificial knee joint: Secondary | ICD-10-CM

## 2019-03-30 NOTE — Progress Notes (Signed)
   Post-Op Visit Note   Patient: Erik Ayers           Date of Birth: [birth year]           MRN: [id number] Visit Date: 03/30/2019 PCP: Toma Deiters, MD   Assessment & Plan:post left TKA. Walking well. Opposite knee gives him some mild problems.   Chief Complaint:  Chief Complaint  Patient presents with  . Left Knee - Follow-up    02/17/2019 Left TKA   Visit Diagnoses:  1. Unilateral primary osteoarthritis, left knee   2. S/P total knee arthroplasty, left   3. Metatarsalgia, right foot     Plan: RTW 04/17/19.  He is walking well and quad is getting stronger each week.   Follow-Up Instructions: Return in about 3 months (around 06/30/2019).   Orders:  No orders of the defined types were placed in this encounter.  No orders of the defined types were placed in this encounter.   Imaging: No results found.  PMFS History: Patient Active Problem List   Diagnosis Date Noted  . S/P total knee arthroplasty, left 03/30/2019  . Metatarsalgia, right foot 03/30/2019  . Arthritis of left knee 02/17/2019  . Knee locking, left 12/15/2018   Past Medical History:  Diagnosis Date  . Alcohol abuse   . Arthritis   . Depression   . GERD (gastroesophageal reflux disease)   . H/O bladder problems   . Hypertension   . Migraines     No family history on file.  Past Surgical History:  Procedure Laterality Date  . COLONOSCOPY    . HERNIA REPAIR    . TOTAL KNEE ARTHROPLASTY Left 02/17/2019   Procedure: LEFT TOTAL KNEE ARTHROPLASTY;  Surgeon: Eldred Manges, MD;  Location: [facility] OR;  Service: Orthopedics;  Laterality: Left;   Social History   Occupational History  . Not on file  Tobacco Use  . Smoking status: Former Smoker    Quit date: 2020    Years since quitting: 1.2  . Smokeless tobacco: Never Used  Substance and Sexual Activity  . Alcohol use: Yes    Comment: ONCE PER MONTH  . Drug use: Never  . Sexual activity: Not on file

## 2020-05-16 ENCOUNTER — Ambulatory Visit: Payer: Managed Care, Other (non HMO) | Admitting: Orthopaedic Surgery

## 2020-09-18 DIAGNOSIS — K219 Gastro-esophageal reflux disease without esophagitis: Secondary | ICD-10-CM | POA: Diagnosis not present

## 2020-09-18 DIAGNOSIS — I10 Essential (primary) hypertension: Secondary | ICD-10-CM | POA: Diagnosis not present

## 2020-09-18 DIAGNOSIS — F411 Generalized anxiety disorder: Secondary | ICD-10-CM | POA: Diagnosis not present

## 2020-09-18 DIAGNOSIS — J301 Allergic rhinitis due to pollen: Secondary | ICD-10-CM | POA: Diagnosis not present

## 2020-11-02 IMAGING — DX DG KNEE 1-2V*L*
2 series · 2 of 2 positions shown · non-contrast
Comparison: None.

CLINICAL DATA: Left knee replacement

EXAM:
LEFT KNEE - 1-2 VIEW

[knee ap]
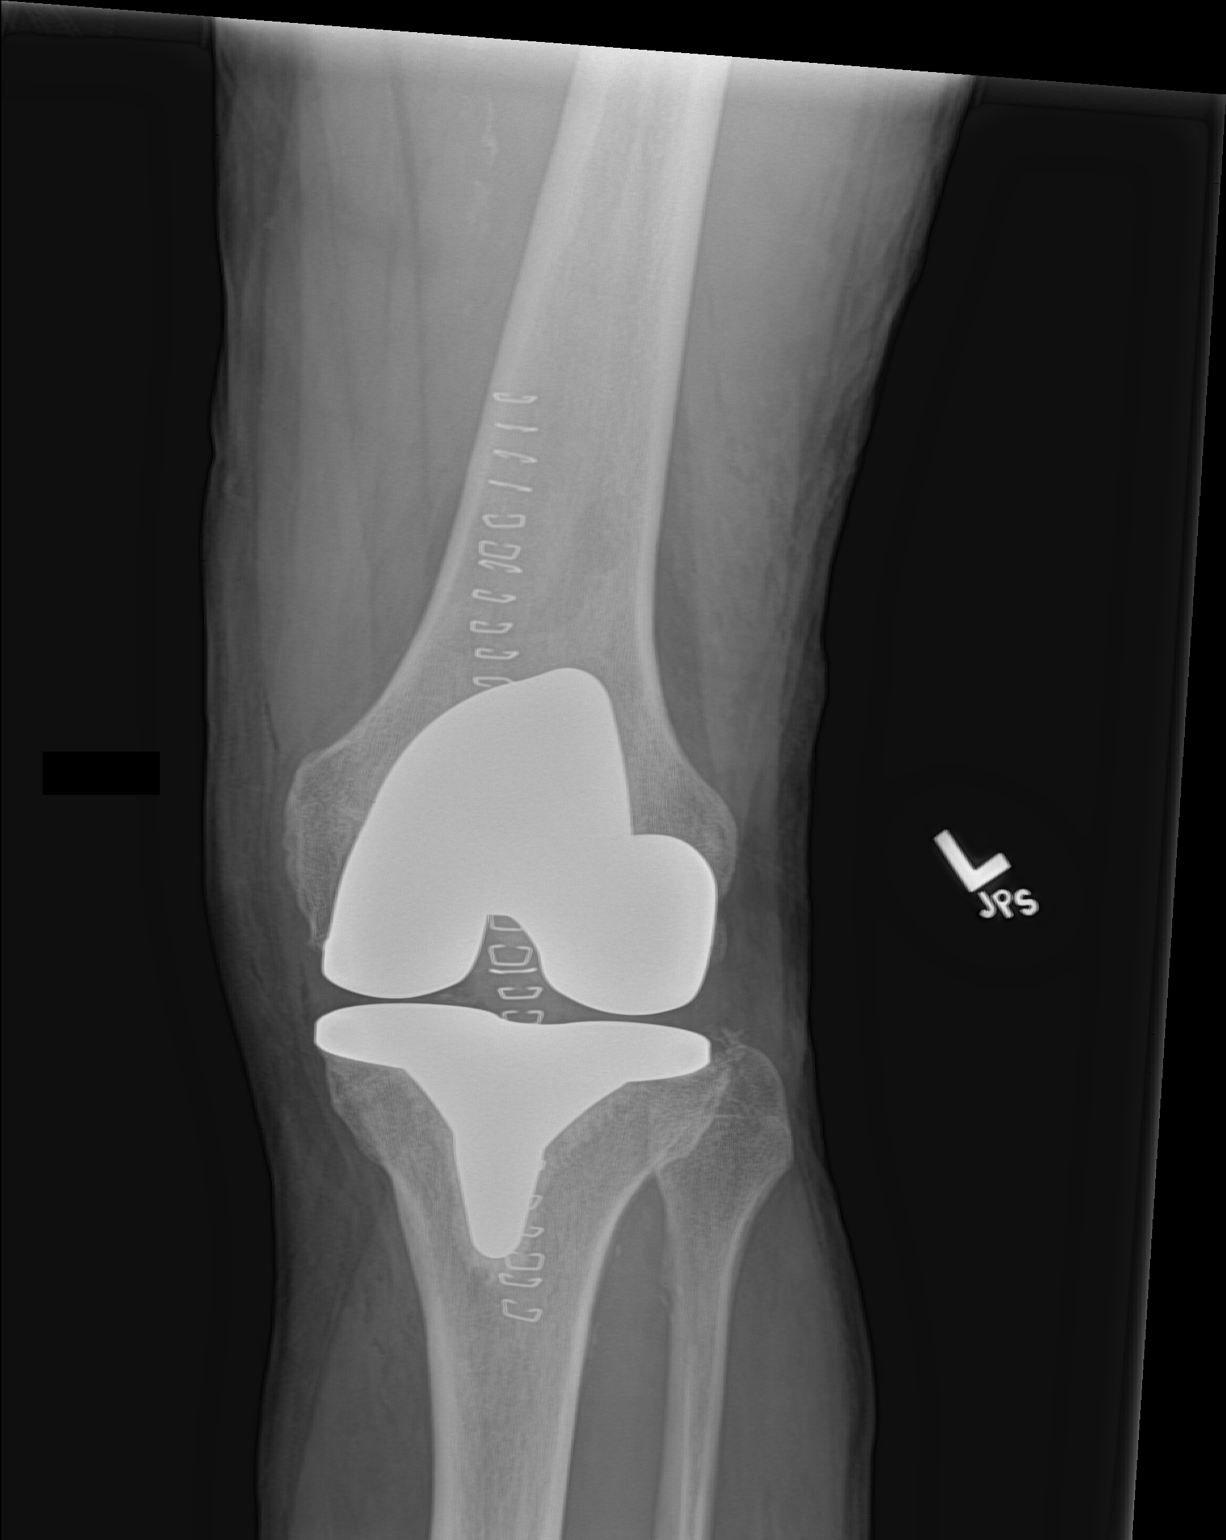

[knee lat]
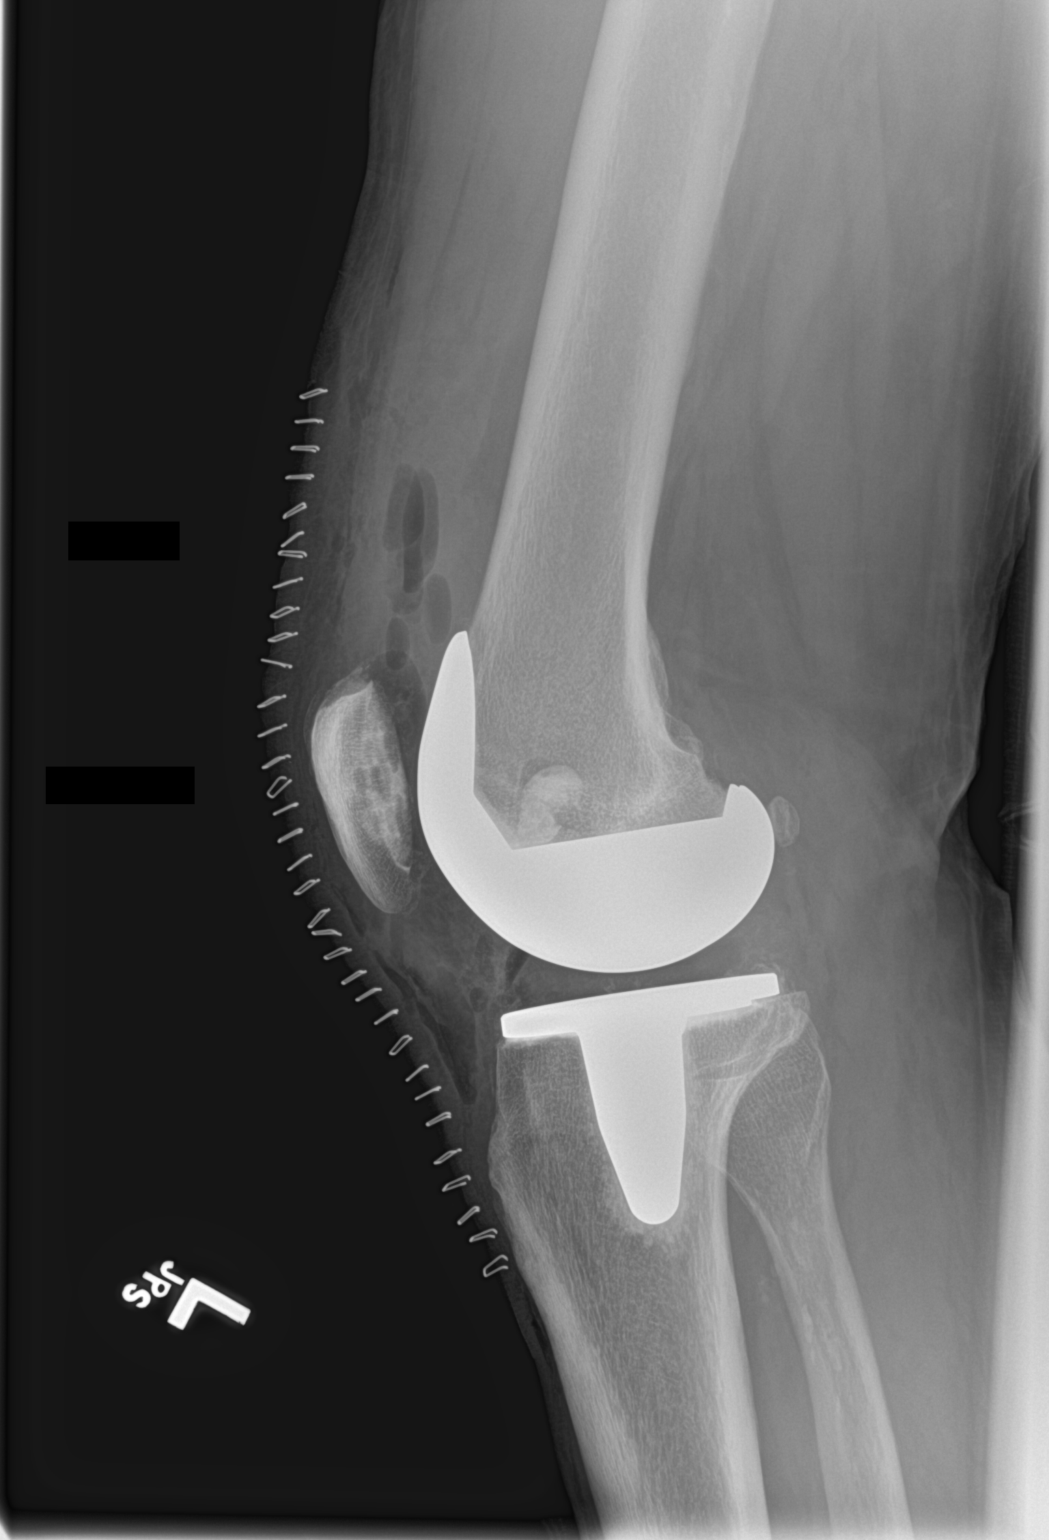

[2 of 2 positions shown; findings below may reference images not displayed]

FINDINGS: Skin staples are identified. The femoral and tibial components of
the knee replacement are in good position. Postoperative air seen in
the soft tissues.
IMPRESSION: Left knee replacement as above.

## 2020-12-18 DIAGNOSIS — K219 Gastro-esophageal reflux disease without esophagitis: Secondary | ICD-10-CM | POA: Diagnosis not present

## 2020-12-18 DIAGNOSIS — Z0000 Encounter for general adult medical examination without abnormal findings: Secondary | ICD-10-CM | POA: Diagnosis not present

## 2020-12-18 DIAGNOSIS — Z6831 Body mass index (BMI) 31.0-31.9, adult: Secondary | ICD-10-CM | POA: Diagnosis not present

## 2020-12-18 DIAGNOSIS — Z125 Encounter for screening for malignant neoplasm of prostate: Secondary | ICD-10-CM | POA: Diagnosis not present

## 2020-12-18 DIAGNOSIS — J301 Allergic rhinitis due to pollen: Secondary | ICD-10-CM | POA: Diagnosis not present

## 2020-12-18 DIAGNOSIS — F411 Generalized anxiety disorder: Secondary | ICD-10-CM | POA: Diagnosis not present

## 2021-03-19 DIAGNOSIS — I10 Essential (primary) hypertension: Secondary | ICD-10-CM | POA: Diagnosis not present

## 2021-03-19 DIAGNOSIS — E782 Mixed hyperlipidemia: Secondary | ICD-10-CM | POA: Diagnosis not present

## 2021-03-19 DIAGNOSIS — F411 Generalized anxiety disorder: Secondary | ICD-10-CM | POA: Diagnosis not present

## 2021-03-19 DIAGNOSIS — J301 Allergic rhinitis due to pollen: Secondary | ICD-10-CM | POA: Diagnosis not present

## 2021-04-16 DIAGNOSIS — I2510 Atherosclerotic heart disease of native coronary artery without angina pectoris: Secondary | ICD-10-CM | POA: Diagnosis not present

## 2021-04-16 DIAGNOSIS — Z122 Encounter for screening for malignant neoplasm of respiratory organs: Secondary | ICD-10-CM | POA: Diagnosis not present

## 2021-04-16 DIAGNOSIS — F17210 Nicotine dependence, cigarettes, uncomplicated: Secondary | ICD-10-CM | POA: Diagnosis not present

## 2021-04-16 DIAGNOSIS — I700 Atherosclerosis of aorta: Secondary | ICD-10-CM | POA: Diagnosis not present

## 2021-04-16 DIAGNOSIS — J432 Centrilobular emphysema: Secondary | ICD-10-CM | POA: Diagnosis not present

## 2021-04-16 DIAGNOSIS — J438 Other emphysema: Secondary | ICD-10-CM | POA: Diagnosis not present

## 2021-05-08 DIAGNOSIS — M810 Age-related osteoporosis without current pathological fracture: Secondary | ICD-10-CM | POA: Diagnosis not present

## 2021-05-08 DIAGNOSIS — Z7952 Long term (current) use of systemic steroids: Secondary | ICD-10-CM | POA: Diagnosis not present

## 2021-06-19 DIAGNOSIS — Z0000 Encounter for general adult medical examination without abnormal findings: Secondary | ICD-10-CM | POA: Diagnosis not present

## 2021-06-19 DIAGNOSIS — I10 Essential (primary) hypertension: Secondary | ICD-10-CM | POA: Diagnosis not present

## 2021-06-19 DIAGNOSIS — I700 Atherosclerosis of aorta: Secondary | ICD-10-CM | POA: Diagnosis not present

## 2021-06-19 DIAGNOSIS — I2510 Atherosclerotic heart disease of native coronary artery without angina pectoris: Secondary | ICD-10-CM | POA: Diagnosis not present

## 2021-06-19 DIAGNOSIS — E782 Mixed hyperlipidemia: Secondary | ICD-10-CM | POA: Diagnosis not present

## 2021-07-23 DIAGNOSIS — Z6830 Body mass index (BMI) 30.0-30.9, adult: Secondary | ICD-10-CM | POA: Diagnosis not present

## 2021-07-23 DIAGNOSIS — H6690 Otitis media, unspecified, unspecified ear: Secondary | ICD-10-CM | POA: Diagnosis not present

## 2021-10-23 DIAGNOSIS — J301 Allergic rhinitis due to pollen: Secondary | ICD-10-CM | POA: Diagnosis not present

## 2021-10-23 DIAGNOSIS — M150 Primary generalized (osteo)arthritis: Secondary | ICD-10-CM | POA: Diagnosis not present

## 2021-10-23 DIAGNOSIS — M25561 Pain in right knee: Secondary | ICD-10-CM | POA: Diagnosis not present

## 2021-10-23 DIAGNOSIS — K219 Gastro-esophageal reflux disease without esophagitis: Secondary | ICD-10-CM | POA: Diagnosis not present

## 2021-10-23 DIAGNOSIS — I10 Essential (primary) hypertension: Secondary | ICD-10-CM | POA: Diagnosis not present

## 2021-10-23 DIAGNOSIS — M1711 Unilateral primary osteoarthritis, right knee: Secondary | ICD-10-CM | POA: Diagnosis not present

## 2021-10-30 DIAGNOSIS — Z6832 Body mass index (BMI) 32.0-32.9, adult: Secondary | ICD-10-CM | POA: Diagnosis not present

## 2021-10-30 DIAGNOSIS — J0110 Acute frontal sinusitis, unspecified: Secondary | ICD-10-CM | POA: Diagnosis not present

## 2021-12-22 DIAGNOSIS — H5203 Hypermetropia, bilateral: Secondary | ICD-10-CM | POA: Diagnosis not present

## 2021-12-31 DIAGNOSIS — K2971 Gastritis, unspecified, with bleeding: Secondary | ICD-10-CM | POA: Diagnosis not present

## 2021-12-31 DIAGNOSIS — Z6832 Body mass index (BMI) 32.0-32.9, adult: Secondary | ICD-10-CM | POA: Diagnosis not present

## 2022-03-31 DIAGNOSIS — Z0000 Encounter for general adult medical examination without abnormal findings: Secondary | ICD-10-CM | POA: Diagnosis not present

## 2022-03-31 DIAGNOSIS — Z79899 Other long term (current) drug therapy: Secondary | ICD-10-CM | POA: Diagnosis not present

## 2022-03-31 DIAGNOSIS — I700 Atherosclerosis of aorta: Secondary | ICD-10-CM | POA: Diagnosis not present

## 2022-03-31 DIAGNOSIS — I2510 Atherosclerotic heart disease of native coronary artery without angina pectoris: Secondary | ICD-10-CM | POA: Diagnosis not present

## 2022-03-31 DIAGNOSIS — Z125 Encounter for screening for malignant neoplasm of prostate: Secondary | ICD-10-CM | POA: Diagnosis not present

## 2022-03-31 DIAGNOSIS — K2100 Gastro-esophageal reflux disease with esophagitis, without bleeding: Secondary | ICD-10-CM | POA: Diagnosis not present

## 2022-03-31 DIAGNOSIS — I10 Essential (primary) hypertension: Secondary | ICD-10-CM | POA: Diagnosis not present

## 2022-05-21 DIAGNOSIS — R197 Diarrhea, unspecified: Secondary | ICD-10-CM | POA: Diagnosis not present

## 2022-05-27 ENCOUNTER — Encounter (INDEPENDENT_AMBULATORY_CARE_PROVIDER_SITE_OTHER): Payer: Self-pay | Admitting: *Deleted

## 2022-06-30 DIAGNOSIS — I2510 Atherosclerotic heart disease of native coronary artery without angina pectoris: Secondary | ICD-10-CM | POA: Diagnosis not present

## 2022-06-30 DIAGNOSIS — I700 Atherosclerosis of aorta: Secondary | ICD-10-CM | POA: Diagnosis not present

## 2022-06-30 DIAGNOSIS — Z0000 Encounter for general adult medical examination without abnormal findings: Secondary | ICD-10-CM | POA: Diagnosis not present

## 2022-06-30 DIAGNOSIS — K2100 Gastro-esophageal reflux disease with esophagitis, without bleeding: Secondary | ICD-10-CM | POA: Diagnosis not present

## 2022-06-30 DIAGNOSIS — I10 Essential (primary) hypertension: Secondary | ICD-10-CM | POA: Diagnosis not present

## 2022-07-24 DIAGNOSIS — I2510 Atherosclerotic heart disease of native coronary artery without angina pectoris: Secondary | ICD-10-CM | POA: Diagnosis not present

## 2022-07-24 DIAGNOSIS — I700 Atherosclerosis of aorta: Secondary | ICD-10-CM | POA: Diagnosis not present

## 2022-07-24 DIAGNOSIS — F17210 Nicotine dependence, cigarettes, uncomplicated: Secondary | ICD-10-CM | POA: Diagnosis not present

## 2022-07-24 DIAGNOSIS — Z122 Encounter for screening for malignant neoplasm of respiratory organs: Secondary | ICD-10-CM | POA: Diagnosis not present

## 2022-07-24 DIAGNOSIS — J432 Centrilobular emphysema: Secondary | ICD-10-CM | POA: Diagnosis not present

## 2022-07-27 ENCOUNTER — Encounter (INDEPENDENT_AMBULATORY_CARE_PROVIDER_SITE_OTHER): Payer: Self-pay | Admitting: Gastroenterology

## 2022-07-27 ENCOUNTER — Ambulatory Visit (INDEPENDENT_AMBULATORY_CARE_PROVIDER_SITE_OTHER): Payer: Medicare Other | Admitting: Gastroenterology

## 2022-07-27 VITALS — BP 125/71 | HR 66 | Temp 97.8°F | Ht 70.0 in | Wt 213.5 lb

## 2022-07-27 DIAGNOSIS — R197 Diarrhea, unspecified: Secondary | ICD-10-CM | POA: Diagnosis not present

## 2022-07-27 DIAGNOSIS — R140 Abdominal distension (gaseous): Secondary | ICD-10-CM | POA: Diagnosis not present

## 2022-07-27 NOTE — Progress Notes (Signed)
Katrinka Blazing, M.D. Gastroenterology & Hepatology [facility] [address] [place], [place] [address] Primary Care Physician: Toma Deiters, MD [address] [place] [address]  Referring MD: PCP  Chief Complaint:  diarrhea  History of Present Illness: Erik Ayers is a 67 y.o. male with PMH depression, hypertension, migraines, GERD, who presents for evaluation of diarrhea.  Per referral notes, he was referred to our clinic for evaluation of C. difficile infection.  Patient reports that he received an antibiotic but it is unclear which antibiotic he is taking.  Only lab available in care everywhere was from 05/21/2022 which showed a negative PCR for C. difficile.  He reports he had hemorrhoidal banding in the past - possibly 7 years. He was prescribed a stool softener as management of his hemorrhoids - he took it for multiple years.  He stopped the stool softeners 5 months ago.  Patient reports that 4 months ago he developed new onset of diarrhea - 4-8 bowel movements per day. He denied mucus or blood in stool. He was given an antibiotic (does not remember the name and no information is available about this), but this did not help with his diarrhea. However, he reports that his stool became harder and thicker for the last month - moves his bowels 1-2 times a day.  He denies having to strain to move his bowels.  He reports he lost 20 lb as he was having a lot of diarrhea and he lost his appetite. However, he states he is now eating better and he is gaining some more weight (possibly 5 lb). He is not taking Peptobismol, but sometimes takes Gasex for bloating.  In fact, he reported he is feeling very well at the moment and really does not have a lot of complaints. The patient denies having any nausea, vomiting, fever, chills, hematochezia, melena, hematemesis, abdominal distention, abdominal pain, diarrhea, jaundice, pruritus.  Notably, the  patient is taking olmesartan.  Also has Excedrin in his medication list.   Last QMV:HQION Last Colonoscopy:7 years ago possibly, no reports are available. Does not know when he has to repeat his next procedure. Did have polyps in a previous colonoscopy.  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 No pertinent family history.  Social History: Social History   Tobacco Use  Smoking Status Former   Current packs/day: 0.00   Types: Cigarettes   Quit date: 2020   Years since quitting: 4.5  Smokeless Tobacco Never   Social History   Substance and Sexual Activity  Alcohol Use Yes   Comment: ONCE PER MONTH   Social History   Substance and Sexual Activity  Drug Use Never    Allergies: No Known Allergies  Medications: Current Outpatient Medications  Medication Sig Dispense Refill  aspirin-acetaminophen-caffeine (EXCEDRIN MIGRAINE) 250-250-65 MG tablet Take 1 tablet by mouth every 6 (six) hours as needed for headache.     atorvastatin (LIPITOR) 40 MG tablet Take 40 mg by mouth daily.     bismuth subsalicylate (PEPTO BISMOL) 262 MG/15ML suspension Take 30 mLs by mouth every 4 (four) hours as needed.     busPIRone (BUSPAR) 15 MG tablet Take 15 mg by mouth 2 (two) times daily.     gabapentin (NEURONTIN) 100 MG capsule Take 100 mg by mouth daily  at 6 (six) AM.     hydrochlorothiazide (HYDRODIURIL) 12.5 MG tablet Take 12.5 mg by mouth daily.     lansoprazole (PREVACID) 30 MG capsule Take 30 mg by mouth daily as needed (acid reflux).      Multiple Vitamin (MULTIVITAMIN WITH MINERALS) TABS tablet Take 1 tablet by mouth daily.     olmesartan (BENICAR) 40 MG tablet Take 40 mg by mouth daily.     Omega-3 Fatty Acids (FISH OIL) 1000 MG CAPS Take 1,000 mg by mouth 3 (three) times daily.     sertraline (ZOLOFT) 100 MG tablet Take 100 mg by mouth daily.     simethicone (MYLICON) 80 MG chewable tablet Chew 80 mg by mouth every 6 (six) hours as needed for flatulence.     No current facility-administered medications for this visit.    Review of Systems: GENERAL: negative for malaise, night sweats HEENT: No changes in hearing or vision, no nose bleeds or other nasal problems. NECK: Negative for lumps, goiter, pain and significant neck swelling RESPIRATORY: Negative for cough, wheezing CARDIOVASCULAR: Negative for chest pain, leg swelling, palpitations, orthopnea GI: SEE HPI MUSCULOSKELETAL: Negative for joint pain or swelling, back pain, and muscle pain. SKIN: Negative for lesions, rash PSYCH: Negative for sleep disturbance, mood disorder and recent psychosocial stressors. HEMATOLOGY Negative for prolonged bleeding, bruising easily, and swollen nodes. ENDOCRINE: Negative for cold or heat intolerance, polyuria, polydipsia and goiter. NEURO: negative for tremor, gait imbalance, syncope and seizures. The remainder of the review of systems is noncontributory.   Physical Exam: BP 125/71 (BP Location: Left Arm, Patient Position: Sitting, Cuff Size: Large)   Pulse 66   Temp 97.8 F (36.6 C) (Temporal)   Ht 5\' 10"  (1.778 m)   Wt 213 lb 8 oz (96.8 kg)   BMI 30.63 kg/m  GENERAL: The patient is AO x3, in no acute distress. HEENT: Head is normocephalic and atraumatic. EOMI are intact. Mouth is well hydrated and without lesions. NECK: Supple. No  masses LUNGS: Clear to auscultation. No presence of rhonchi/wheezing/rales. Adequate chest expansion HEART: RRR, normal s1 and s2. ABDOMEN: Soft, nontender, no guarding, no peritoneal signs, and nondistended. BS +. No masses. EXTREMITIES: Without any cyanosis, clubbing, rash, lesions or edema. NEUROLOGIC: AOx3, no focal motor deficit. SKIN: no jaundice, no rashes   Imaging/Labs: as above  I personally reviewed and interpreted the available labs, imaging and endoscopic files.  Impression and Plan: Tee Richeson is a 67 y.o. male with PMH depression, hypertension, migraines, GERD, who presents for evaluation of diarrhea.  Patient had an episode of subacute diarrhea which has now resolved.  It is unclear what led to these symptoms but based on the available information, it does not seem that he had a C. difficile infection or any isolated documented microorganism.  Fortunately, his symptoms have resolved for the last month and he denies any complaints.  No further workup is needed at the moment.  He can benefit from using Gas-X and IBgard  as needed to improve his mild abdominal bloating.  Notably, we discussed that olmesartan may cause enteropathy, for which I advised him to follow-up with his PCP and discuss the possibility of switching to another agent.  - Discuss with PCP if possible to switch olmesartan for other antihypertensive drug -Can take Gasex as needed for bloating -Will request most recent colonoscopy report -Take IBGard 1 tablet every 8-12 hours as needed for bloating episodes  All questions were answered.      Katrinka Blazing, MD Gastroenterology and Hepatology [facility]

## 2022-07-27 NOTE — Patient Instructions (Signed)
Discuss with PCP if possible to switch olmesartan for other antihypertensive drug Can take Gasex as needed for bloating Will request most recent colonoscopy report Take IBGard 1 tablet every 8-12 hours as needed for bloating episodes

## 2022-08-19 DIAGNOSIS — I700 Atherosclerosis of aorta: Secondary | ICD-10-CM | POA: Diagnosis not present

## 2022-08-19 DIAGNOSIS — I10 Essential (primary) hypertension: Secondary | ICD-10-CM | POA: Diagnosis not present

## 2022-08-19 DIAGNOSIS — J449 Chronic obstructive pulmonary disease, unspecified: Secondary | ICD-10-CM | POA: Diagnosis not present

## 2022-08-19 DIAGNOSIS — I2510 Atherosclerotic heart disease of native coronary artery without angina pectoris: Secondary | ICD-10-CM | POA: Diagnosis not present

## 2022-10-07 DIAGNOSIS — I2510 Atherosclerotic heart disease of native coronary artery without angina pectoris: Secondary | ICD-10-CM | POA: Diagnosis not present

## 2022-10-07 DIAGNOSIS — K2100 Gastro-esophageal reflux disease with esophagitis, without bleeding: Secondary | ICD-10-CM | POA: Diagnosis not present

## 2022-10-07 DIAGNOSIS — I10 Essential (primary) hypertension: Secondary | ICD-10-CM | POA: Diagnosis not present

## 2022-10-07 DIAGNOSIS — I700 Atherosclerosis of aorta: Secondary | ICD-10-CM | POA: Diagnosis not present

## 2022-11-03 DIAGNOSIS — F3342 Major depressive disorder, recurrent, in full remission: Secondary | ICD-10-CM | POA: Diagnosis not present

## 2022-11-18 DIAGNOSIS — J449 Chronic obstructive pulmonary disease, unspecified: Secondary | ICD-10-CM | POA: Diagnosis not present

## 2022-11-18 DIAGNOSIS — I10 Essential (primary) hypertension: Secondary | ICD-10-CM | POA: Diagnosis not present

## 2022-11-18 DIAGNOSIS — I2510 Atherosclerotic heart disease of native coronary artery without angina pectoris: Secondary | ICD-10-CM | POA: Diagnosis not present

## 2022-11-18 DIAGNOSIS — N400 Enlarged prostate without lower urinary tract symptoms: Secondary | ICD-10-CM | POA: Diagnosis not present

## 2022-11-18 DIAGNOSIS — I700 Atherosclerosis of aorta: Secondary | ICD-10-CM | POA: Diagnosis not present

## 2023-02-24 DIAGNOSIS — I10 Essential (primary) hypertension: Secondary | ICD-10-CM | POA: Diagnosis not present

## 2023-02-24 DIAGNOSIS — N400 Enlarged prostate without lower urinary tract symptoms: Secondary | ICD-10-CM | POA: Diagnosis not present

## 2023-02-24 DIAGNOSIS — J449 Chronic obstructive pulmonary disease, unspecified: Secondary | ICD-10-CM | POA: Diagnosis not present

## 2023-02-24 DIAGNOSIS — I2510 Atherosclerotic heart disease of native coronary artery without angina pectoris: Secondary | ICD-10-CM | POA: Diagnosis not present

## 2023-02-24 DIAGNOSIS — Z0000 Encounter for general adult medical examination without abnormal findings: Secondary | ICD-10-CM | POA: Diagnosis not present

## 2023-05-04 DIAGNOSIS — H52223 Regular astigmatism, bilateral: Secondary | ICD-10-CM | POA: Diagnosis not present

## 2023-05-11 DIAGNOSIS — J449 Chronic obstructive pulmonary disease, unspecified: Secondary | ICD-10-CM | POA: Diagnosis not present

## 2023-05-11 DIAGNOSIS — I10 Essential (primary) hypertension: Secondary | ICD-10-CM | POA: Diagnosis not present

## 2023-05-27 DIAGNOSIS — F411 Generalized anxiety disorder: Secondary | ICD-10-CM | POA: Diagnosis not present

## 2023-05-27 DIAGNOSIS — K2100 Gastro-esophageal reflux disease with esophagitis, without bleeding: Secondary | ICD-10-CM | POA: Diagnosis not present

## 2023-05-27 DIAGNOSIS — I2510 Atherosclerotic heart disease of native coronary artery without angina pectoris: Secondary | ICD-10-CM | POA: Diagnosis not present

## 2023-05-27 DIAGNOSIS — I700 Atherosclerosis of aorta: Secondary | ICD-10-CM | POA: Diagnosis not present

## 2023-05-27 DIAGNOSIS — I10 Essential (primary) hypertension: Secondary | ICD-10-CM | POA: Diagnosis not present

## 2023-05-27 DIAGNOSIS — Z125 Encounter for screening for malignant neoplasm of prostate: Secondary | ICD-10-CM | POA: Diagnosis not present

## 2023-05-27 DIAGNOSIS — Z0000 Encounter for general adult medical examination without abnormal findings: Secondary | ICD-10-CM | POA: Diagnosis not present

## 2023-06-09 DIAGNOSIS — I10 Essential (primary) hypertension: Secondary | ICD-10-CM | POA: Diagnosis not present

## 2023-06-09 DIAGNOSIS — J449 Chronic obstructive pulmonary disease, unspecified: Secondary | ICD-10-CM | POA: Diagnosis not present

## 2023-07-01 DIAGNOSIS — L570 Actinic keratosis: Secondary | ICD-10-CM | POA: Diagnosis not present

## 2023-07-01 DIAGNOSIS — L821 Other seborrheic keratosis: Secondary | ICD-10-CM | POA: Diagnosis not present

## 2023-07-20 DIAGNOSIS — F3342 Major depressive disorder, recurrent, in full remission: Secondary | ICD-10-CM | POA: Diagnosis not present

## 2023-07-20 DIAGNOSIS — M810 Age-related osteoporosis without current pathological fracture: Secondary | ICD-10-CM | POA: Diagnosis not present

## 2023-07-26 DIAGNOSIS — M2141 Flat foot [pes planus] (acquired), right foot: Secondary | ICD-10-CM | POA: Diagnosis not present

## 2023-07-26 DIAGNOSIS — L851 Acquired keratosis [keratoderma] palmaris et plantaris: Secondary | ICD-10-CM | POA: Diagnosis not present

## 2023-07-26 DIAGNOSIS — M79672 Pain in left foot: Secondary | ICD-10-CM | POA: Diagnosis not present

## 2023-07-26 DIAGNOSIS — M2142 Flat foot [pes planus] (acquired), left foot: Secondary | ICD-10-CM | POA: Diagnosis not present

## 2023-07-26 DIAGNOSIS — M79671 Pain in right foot: Secondary | ICD-10-CM | POA: Diagnosis not present

## 2023-09-02 DIAGNOSIS — N400 Enlarged prostate without lower urinary tract symptoms: Secondary | ICD-10-CM | POA: Diagnosis not present

## 2023-09-02 DIAGNOSIS — J449 Chronic obstructive pulmonary disease, unspecified: Secondary | ICD-10-CM | POA: Diagnosis not present

## 2023-09-02 DIAGNOSIS — I2510 Atherosclerotic heart disease of native coronary artery without angina pectoris: Secondary | ICD-10-CM | POA: Diagnosis not present

## 2023-09-02 DIAGNOSIS — I10 Essential (primary) hypertension: Secondary | ICD-10-CM | POA: Diagnosis not present

## 2023-10-01 DIAGNOSIS — Z87891 Personal history of nicotine dependence: Secondary | ICD-10-CM | POA: Diagnosis not present

## 2023-10-01 DIAGNOSIS — F17210 Nicotine dependence, cigarettes, uncomplicated: Secondary | ICD-10-CM | POA: Diagnosis not present

## 2023-10-01 DIAGNOSIS — Z122 Encounter for screening for malignant neoplasm of respiratory organs: Secondary | ICD-10-CM | POA: Diagnosis not present

## 2023-10-14 DIAGNOSIS — I700 Atherosclerosis of aorta: Secondary | ICD-10-CM | POA: Diagnosis not present

## 2023-10-14 DIAGNOSIS — I2510 Atherosclerotic heart disease of native coronary artery without angina pectoris: Secondary | ICD-10-CM | POA: Diagnosis not present

## 2023-10-14 DIAGNOSIS — J310 Chronic rhinitis: Secondary | ICD-10-CM | POA: Diagnosis not present

## 2023-10-14 DIAGNOSIS — K2100 Gastro-esophageal reflux disease with esophagitis, without bleeding: Secondary | ICD-10-CM | POA: Diagnosis not present

## 2023-10-14 DIAGNOSIS — I10 Essential (primary) hypertension: Secondary | ICD-10-CM | POA: Diagnosis not present

## 2023-11-10 ENCOUNTER — Encounter (INDEPENDENT_AMBULATORY_CARE_PROVIDER_SITE_OTHER): Payer: Self-pay | Admitting: Gastroenterology
